# Patient Record
Sex: Female | Born: 1950 | Race: White | Hispanic: No | Smoking: Never smoker
Health system: Southern US, Community
[De-identification: ages and names within clinical notes are randomized; demographics above are authoritative.]

## PROBLEM LIST (undated history)

## (undated) DIAGNOSIS — E119 Type 2 diabetes mellitus without complications: Secondary | ICD-10-CM

## (undated) DIAGNOSIS — F32A Depression, unspecified: Secondary | ICD-10-CM

## (undated) DIAGNOSIS — M1990 Unspecified osteoarthritis, unspecified site: Secondary | ICD-10-CM

## (undated) DIAGNOSIS — Z9889 Other specified postprocedural states: Secondary | ICD-10-CM

## (undated) DIAGNOSIS — K219 Gastro-esophageal reflux disease without esophagitis: Secondary | ICD-10-CM

## (undated) HISTORY — PX: CHOLECYSTECTOMY: SHX55

## (undated) HISTORY — PX: ABDOMINAL HYSTERECTOMY: SHX81

---

## 2020-08-31 ENCOUNTER — Encounter: Payer: Self-pay | Admitting: Emergency Medicine

## 2020-08-31 ENCOUNTER — Ambulatory Visit (INDEPENDENT_AMBULATORY_CARE_PROVIDER_SITE_OTHER): Payer: Self-pay

## 2020-08-31 ENCOUNTER — Ambulatory Visit
Admission: EM | Admit: 2020-08-31 | Discharge: 2020-08-31 | Disposition: A | Discharge status: Home / Self Care | Payer: Medicare Other | Attending: Family Medicine | Admitting: Family Medicine

## 2020-08-31 ENCOUNTER — Telehealth: Payer: Self-pay | Admitting: Emergency Medicine

## 2020-08-31 ENCOUNTER — Other Ambulatory Visit: Payer: Self-pay

## 2020-08-31 DIAGNOSIS — R42 Dizziness and giddiness: Secondary | ICD-10-CM

## 2020-08-31 DIAGNOSIS — B349 Viral infection, unspecified: Secondary | ICD-10-CM | POA: Diagnosis not present

## 2020-08-31 DIAGNOSIS — R5383 Other fatigue: Secondary | ICD-10-CM | POA: Diagnosis not present

## 2020-08-31 DIAGNOSIS — R059 Cough, unspecified: Secondary | ICD-10-CM

## 2020-08-31 DIAGNOSIS — R630 Anorexia: Secondary | ICD-10-CM

## 2020-08-31 DIAGNOSIS — R0602 Shortness of breath: Secondary | ICD-10-CM

## 2020-08-31 DIAGNOSIS — R6889 Other general symptoms and signs: Secondary | ICD-10-CM | POA: Diagnosis not present

## 2020-08-31 HISTORY — DX: Unspecified osteoarthritis, unspecified site: M19.90

## 2020-08-31 MED ORDER — PROMETHAZINE-DM 6.25-15 MG/5ML PO SYRP: 5.0000 mL | ORAL_SOLUTION | Freq: Four times a day (QID) | ORAL | 0 refills | Status: DC | PRN

## 2020-08-31 MED ORDER — PREDNISONE 10 MG (21) PO TBPK: ORAL_TABLET | Freq: Every day | ORAL | 0 refills | Status: AC

## 2020-08-31 NOTE — ED Provider Notes (Signed)
[facility]   [id number] 08/31/20 Arrival Time: 1126   CC: COVID symptoms  SUBJECTIVE: History from: patient.  Marcha Licklider is a 70 y.o. female who presents with headache, intermittent dizziness and bilateral ear pain for the last week. Denies sick exposure to COVID, flu or strep. Denies recent travel. Has negative history of Covid. Has not completed Covid vaccines. Has not taken OTC medications for this. Reports that dizziness is worse with activity. Denies previous symptoms in the past. Denies fever, chills, fatigue, sinus pain, rhinorrhea, sore throat, SOB, wheezing, chest pain, nausea, changes in bowel or bladder habits.    ROS: As per HPI.  All other pertinent ROS negative.     Past Medical History:  Diagnosis Date  . Arthritis    Past Surgical History:  Procedure Laterality Date  . ABDOMINAL HYSTERECTOMY    . CHOLECYSTECTOMY     Allergies  Allergen Reactions  . Codeine    No current facility-administered medications on file prior to encounter.   No current outpatient medications on file prior to encounter.   Social History   Socioeconomic History  . Marital status: Widowed    Spouse name: Not on file  . Number of children: Not on file  . Years of education: Not on file  . Highest education level: Not on file  Occupational History  . Not on file  Tobacco Use  . Smoking status: Never Smoker  . Smokeless tobacco: Never Used  Vaping Use  . Vaping Use: Never used  Substance and Sexual Activity  . Alcohol use: Never  . Drug use: Never  . Sexual activity: Not on file  Other Topics Concern  . Not on file  Social History Narrative  . Not on file   Social Determinants of Health   Financial Resource Strain: Not on file  Food Insecurity: Not on file  Transportation Needs: Not on file  Physical Activity: Not on file  Stress: Not on file  Social Connections: Not on file  Intimate Partner Violence: Not on file   History reviewed. No pertinent family  history.  OBJECTIVE:  Vitals:   08/31/20 1152 08/31/20 1153  BP: (!) 164/99   Pulse: (!) 102   Resp: 20   Temp: 97.7 F (36.5 C)   SpO2: 95%   Weight:  180 lb (81.6 kg)  Height:  4' 11.5" (1.511 m)     General appearance: alert; appears fatigued, but nontoxic; speaking in full sentences and tolerating own secretions HEENT: NCAT; Ears: EACs clear, TMs pearly gray; Eyes: PERRL.  EOM grossly intact. Sinuses: nontender; Nose: nares patent with clear rhinorrhea, Throat: oropharynx erythematous, cobblestoning present, tonsils non erythematous or enlarged, uvula midline  Neck: supple without LAD Lungs: unlabored respirations, symmetrical air entry; cough: moderate; no respiratory distress; diminished lung sounds throughout bilateral lung fields Heart: regular rate and rhythm.  Radial pulses 2+ symmetrical bilaterally Skin: warm and dry Psychological: alert and cooperative; normal mood and affect  LABS:  No results found for this or any previous visit (from the past 24 hour(s)).   ASSESSMENT & PLAN:  1. Viral illness   2. Cough   3. Other fatigue   4. SOB (shortness of breath)   5. Loss of appetite   6. Dizziness and giddiness    Promethazine cough syrup prescribed Sedation precautions given I have also prescribed a steroid taper Will treat like bronchitis for now Chest xray negative for pneumonia Continue supportive care at home COVID and flu testing ordered.  It  will take between 2-3 days for test results. Someone will contact you regarding abnormal results.   Patient should remain in quarantine until they have received Covid results.  If negative you may resume normal activities (go back to work/school) while practicing hand hygiene, social distance, and mask wearing.  If positive, patient should remain in quarantine for at least 5 days from symptom onset AND greater than 72 hours after symptoms resolution (absence of fever without the use of fever-reducing medication and  improvement in respiratory symptoms), whichever is longer Get plenty of rest and push fluids Use OTC zyrtec for nasal congestion, runny nose, and/or sore throat Use OTC flonase for nasal congestion and runny nose Use medications daily for symptom relief Use OTC medications like ibuprofen or tylenol as needed fever or pain Call or go to the ED if you have any new or worsening symptoms such as fever, worsening cough, shortness of breath, chest tightness, chest pain, turning blue, changes in mental status.  Reviewed expectations re: course of current medical issues. Questions answered. Outlined signs and symptoms indicating need for more acute intervention. Patient verbalized understanding. After Visit Summary given.         Moshe Cipro, NP 09/02/20 1719

## 2020-08-31 NOTE — Discharge Instructions (Addendum)
Chest xray today is negative.  I have sent in cough syrup for you to take. This medication can make you sleepy. Do not drive while taking this medication.  I have sent in a prednisone taper for you to take for 6 days. 6 tablets on day one, 5 tablets on day two, 4 tablets on day three, 3 tablets on day four, 2 tablets on day five, and 1 tablet on day six.  Your COVID and Flu tests are pending.  You should self quarantine until the test results are back.    Take Tylenol or ibuprofen as needed for fever or discomfort.  Rest and keep yourself hydrated.    Follow-up with your primary care provider if your symptoms are not improving.

## 2020-08-31 NOTE — Telephone Encounter (Signed)
Meds to [facility] in [place]

## 2020-08-31 NOTE — ED Triage Notes (Signed)
Symptoms started last Saturday, headache, bilateral ear pain, dizzy

## 2020-09-03 DIAGNOSIS — R1110 Vomiting, unspecified: Secondary | ICD-10-CM | POA: Diagnosis not present

## 2020-09-03 DIAGNOSIS — R0902 Hypoxemia: Secondary | ICD-10-CM | POA: Diagnosis not present

## 2020-09-03 DIAGNOSIS — J1282 Pneumonia due to coronavirus disease 2019: Secondary | ICD-10-CM | POA: Diagnosis not present

## 2020-09-03 DIAGNOSIS — U071 COVID-19: Secondary | ICD-10-CM | POA: Diagnosis not present

## 2020-09-03 DIAGNOSIS — K529 Noninfective gastroenteritis and colitis, unspecified: Secondary | ICD-10-CM | POA: Diagnosis not present

## 2020-09-03 DIAGNOSIS — Z885 Allergy status to narcotic agent status: Secondary | ICD-10-CM | POA: Diagnosis not present

## 2020-09-03 DIAGNOSIS — R059 Cough, unspecified: Secondary | ICD-10-CM | POA: Diagnosis not present

## 2020-09-03 DIAGNOSIS — R197 Diarrhea, unspecified: Secondary | ICD-10-CM | POA: Diagnosis not present

## 2020-09-03 DIAGNOSIS — R112 Nausea with vomiting, unspecified: Secondary | ICD-10-CM | POA: Diagnosis not present

## 2020-09-03 DIAGNOSIS — J9601 Acute respiratory failure with hypoxia: Secondary | ICD-10-CM | POA: Diagnosis not present

## 2020-09-03 DIAGNOSIS — R109 Unspecified abdominal pain: Secondary | ICD-10-CM | POA: Diagnosis not present

## 2020-09-03 DIAGNOSIS — J9811 Atelectasis: Secondary | ICD-10-CM | POA: Diagnosis not present

## 2020-09-03 LAB — COVID-19, FLU A+B NAA
Influenza A, NAA: NOT DETECTED
Influenza B, NAA: NOT DETECTED
SARS-CoV-2, NAA: DETECTED — AB

## 2020-10-09 ENCOUNTER — Encounter (INDEPENDENT_AMBULATORY_CARE_PROVIDER_SITE_OTHER): Payer: Self-pay | Admitting: *Deleted

## 2020-10-22 DIAGNOSIS — Z1389 Encounter for screening for other disorder: Secondary | ICD-10-CM | POA: Diagnosis not present

## 2020-10-22 DIAGNOSIS — R1310 Dysphagia, unspecified: Secondary | ICD-10-CM | POA: Diagnosis not present

## 2020-10-22 DIAGNOSIS — Z86010 Personal history of colonic polyps: Secondary | ICD-10-CM | POA: Diagnosis not present

## 2020-10-22 DIAGNOSIS — H8110 Benign paroxysmal vertigo, unspecified ear: Secondary | ICD-10-CM | POA: Diagnosis not present

## 2020-11-20 DIAGNOSIS — H8110 Benign paroxysmal vertigo, unspecified ear: Secondary | ICD-10-CM | POA: Diagnosis not present

## 2020-11-20 DIAGNOSIS — R1310 Dysphagia, unspecified: Secondary | ICD-10-CM | POA: Diagnosis not present

## 2020-11-20 DIAGNOSIS — Z86010 Personal history of colonic polyps: Secondary | ICD-10-CM | POA: Diagnosis not present

## 2020-12-19 DIAGNOSIS — R1310 Dysphagia, unspecified: Secondary | ICD-10-CM | POA: Diagnosis not present

## 2020-12-19 DIAGNOSIS — H8110 Benign paroxysmal vertigo, unspecified ear: Secondary | ICD-10-CM | POA: Diagnosis not present

## 2020-12-19 DIAGNOSIS — Z86010 Personal history of colonic polyps: Secondary | ICD-10-CM | POA: Diagnosis not present

## 2021-01-06 ENCOUNTER — Ambulatory Visit (INDEPENDENT_AMBULATORY_CARE_PROVIDER_SITE_OTHER): Payer: Self-pay | Admitting: Gastroenterology

## 2021-02-18 DIAGNOSIS — R1310 Dysphagia, unspecified: Secondary | ICD-10-CM | POA: Diagnosis not present

## 2021-02-18 DIAGNOSIS — Z86010 Personal history of colonic polyps: Secondary | ICD-10-CM | POA: Diagnosis not present

## 2021-02-18 DIAGNOSIS — H8110 Benign paroxysmal vertigo, unspecified ear: Secondary | ICD-10-CM | POA: Diagnosis not present

## 2021-03-13 ENCOUNTER — Ambulatory Visit (INDEPENDENT_AMBULATORY_CARE_PROVIDER_SITE_OTHER): Payer: Self-pay | Admitting: Gastroenterology

## 2021-05-05 DIAGNOSIS — H5203 Hypermetropia, bilateral: Secondary | ICD-10-CM | POA: Diagnosis not present

## 2021-05-20 DIAGNOSIS — E7800 Pure hypercholesterolemia, unspecified: Secondary | ICD-10-CM | POA: Diagnosis not present

## 2021-05-20 DIAGNOSIS — Z13220 Encounter for screening for lipoid disorders: Secondary | ICD-10-CM | POA: Diagnosis not present

## 2021-05-23 DIAGNOSIS — H8110 Benign paroxysmal vertigo, unspecified ear: Secondary | ICD-10-CM | POA: Diagnosis not present

## 2021-05-23 DIAGNOSIS — R1310 Dysphagia, unspecified: Secondary | ICD-10-CM | POA: Diagnosis not present

## 2021-05-23 DIAGNOSIS — R7303 Prediabetes: Secondary | ICD-10-CM | POA: Diagnosis not present

## 2021-05-23 DIAGNOSIS — Z86010 Personal history of colonic polyps: Secondary | ICD-10-CM | POA: Diagnosis not present

## 2021-06-09 DIAGNOSIS — E875 Hyperkalemia: Secondary | ICD-10-CM | POA: Diagnosis not present

## 2021-08-18 DIAGNOSIS — R739 Hyperglycemia, unspecified: Secondary | ICD-10-CM | POA: Diagnosis not present

## 2021-08-18 DIAGNOSIS — E7800 Pure hypercholesterolemia, unspecified: Secondary | ICD-10-CM | POA: Diagnosis not present

## 2021-08-18 DIAGNOSIS — Z131 Encounter for screening for diabetes mellitus: Secondary | ICD-10-CM | POA: Diagnosis not present

## 2021-08-28 DIAGNOSIS — H8110 Benign paroxysmal vertigo, unspecified ear: Secondary | ICD-10-CM | POA: Diagnosis not present

## 2021-08-28 DIAGNOSIS — M25519 Pain in unspecified shoulder: Secondary | ICD-10-CM | POA: Diagnosis not present

## 2021-08-28 DIAGNOSIS — Z86010 Personal history of colonic polyps: Secondary | ICD-10-CM | POA: Diagnosis not present

## 2021-08-28 DIAGNOSIS — R1310 Dysphagia, unspecified: Secondary | ICD-10-CM | POA: Diagnosis not present

## 2021-09-04 DIAGNOSIS — K644 Residual hemorrhoidal skin tags: Secondary | ICD-10-CM | POA: Diagnosis not present

## 2021-09-04 DIAGNOSIS — Z1211 Encounter for screening for malignant neoplasm of colon: Secondary | ICD-10-CM | POA: Diagnosis not present

## 2021-09-04 DIAGNOSIS — E119 Type 2 diabetes mellitus without complications: Secondary | ICD-10-CM | POA: Diagnosis not present

## 2021-09-04 DIAGNOSIS — K641 Second degree hemorrhoids: Secondary | ICD-10-CM | POA: Diagnosis not present

## 2021-09-04 DIAGNOSIS — E785 Hyperlipidemia, unspecified: Secondary | ICD-10-CM | POA: Diagnosis not present

## 2021-09-04 DIAGNOSIS — Z86010 Personal history of colonic polyps: Secondary | ICD-10-CM | POA: Diagnosis not present

## 2021-09-04 DIAGNOSIS — I700 Atherosclerosis of aorta: Secondary | ICD-10-CM | POA: Diagnosis not present

## 2021-09-04 DIAGNOSIS — Z8616 Personal history of COVID-19: Secondary | ICD-10-CM | POA: Diagnosis not present

## 2021-09-04 DIAGNOSIS — Z885 Allergy status to narcotic agent status: Secondary | ICD-10-CM | POA: Diagnosis not present

## 2021-09-04 DIAGNOSIS — Q438 Other specified congenital malformations of intestine: Secondary | ICD-10-CM | POA: Diagnosis not present

## 2021-09-04 DIAGNOSIS — K5730 Diverticulosis of large intestine without perforation or abscess without bleeding: Secondary | ICD-10-CM | POA: Diagnosis not present

## 2021-09-04 DIAGNOSIS — K219 Gastro-esophageal reflux disease without esophagitis: Secondary | ICD-10-CM | POA: Diagnosis not present

## 2021-12-29 ENCOUNTER — Ambulatory Visit (INDEPENDENT_AMBULATORY_CARE_PROVIDER_SITE_OTHER): Payer: Medicare Other | Admitting: Ophthalmology

## 2021-12-29 ENCOUNTER — Encounter (INDEPENDENT_AMBULATORY_CARE_PROVIDER_SITE_OTHER): Payer: Self-pay | Admitting: Ophthalmology

## 2021-12-29 DIAGNOSIS — H3322 Serous retinal detachment, left eye: Secondary | ICD-10-CM

## 2021-12-29 DIAGNOSIS — E119 Type 2 diabetes mellitus without complications: Secondary | ICD-10-CM

## 2021-12-29 DIAGNOSIS — H25813 Combined forms of age-related cataract, bilateral: Secondary | ICD-10-CM

## 2021-12-29 DIAGNOSIS — H3581 Retinal edema: Secondary | ICD-10-CM

## 2021-12-29 NOTE — Progress Notes (Addendum)
?[facility] Note ? ?12/29/2021 ? ?  ? ?CHIEF COMPLAINT ?Patient presents for Retina Evaluation ? ? ?HISTORY OF PRESENT ILLNESS: ?Melody Freeman is a 71 y.o. female who presents to the clinic today for:  ? ?HPI   ? ? Retina Evaluation   ?In left eye.  This started 2 weeks ago.  Associated Symptoms Floaters and Distortion.  I, the attending physician,  performed the HPI with the patient and updated documentation appropriately. ? ?  ?  ? ? Comments   ?Patient fell in January and hit her head on pavement. She has noticed for two weeks that the left eye saw big floaters. She has had a vail that is across her vision that started last week.  ? ?  ?  ?Last edited by Bernarda Caffey, MD on 12/31/2021 12:28 AM.  ?  ?Pt is here on the referral of Dr. Jens Som for concern of RD OS, pt states about 2 weeks ago she started seeing new floaters in her vision and about a week ago, she started seeing a veil in her vision, pt denies being hypertensive, but was just dx as diabetic and was put on metformin, she also takes medication for cholesterol and depression, pt states in January, she fell and hit the left side of her head on the pavement, she cannot remember if she saw fol at that time ? ?Referring physician: ?Harlen Labs, MD ?[phone] [facility] [address] ?[place],  [place] [address] ? ?HISTORICAL INFORMATION:  ? ?Selected notes from the [facility] ?Referred by Dr. Marin Comment for concern of RD OS ?LEE:  ?Ocular Hx- ?PMH- ?  ? ?CURRENT MEDICATIONS: ?No current outpatient medications on file. (Ophthalmic Drugs)  ? ?No current facility-administered medications for this visit. (Ophthalmic Drugs)  ? ?Current Outpatient Medications (Other)  ?Medication Sig  ? promethazine-dextromethorphan (PROMETHAZINE-DM) 6.25-15 MG/5ML syrup Take 5 mLs by mouth 4 (four) times daily as needed for cough.  ? metFORMIN (GLUCOPHAGE-XR) 500 MG 24 hr tablet Take 1,000 mg by mouth 2 (two) times daily.  ? omeprazole (PRILOSEC) 20 MG capsule Take 20 mg by  mouth daily.  ? rosuvastatin (CRESTOR) 10 MG tablet SMARTSIG:1 Tablet(s) By Mouth Every Evening  ? sertraline (ZOLOFT) 50 MG tablet Take 50 mg by mouth daily.  ? ?No current facility-administered medications for this visit. (Other)  ? ?REVIEW OF SYSTEMS: ?ROS   ?Positive for: Eyes ?Last edited by [name], COT on 12/29/2021  2:22 PM.  ?  ? ?ALLERGIES ?Allergies  ?Allergen Reactions  ? Codeine   ? ?PAST MEDICAL HISTORY ?Past Medical History:  ?Diagnosis Date  ? Arthritis   ? ?Past Surgical History:  ?Procedure Laterality Date  ? ABDOMINAL HYSTERECTOMY    ? CHOLECYSTECTOMY    ? ? ?FAMILY HISTORY ?History reviewed. No pertinent family history. ? ?SOCIAL HISTORY ?Social History  ? ?Tobacco Use  ? Smoking status: Never  ? Smokeless tobacco: Never  ?Vaping Use  ? Vaping Use: Never used  ?Substance Use Topics  ? Alcohol use: Never  ? Drug use: Never  ?  ? ?  ?OPHTHALMIC EXAM: ? ?Base Eye Exam   ? ? Visual Acuity (Snellen - Linear)   ? ?   Right Left  ? Dist cc 20/25 HM  ? ? Correction: Glasses  ? ?  ?  ? ? Tonometry (Tonopen, 2:27 PM)   ? ?   Right Left  ? Pressure 17 19  ? ?  ?  ? ?  Pupils   ? ?   Dark  ? Right Dilated  ? Left Dilated  ? ?  ?  ? ? Visual Fields   ? ?   Left Right  ?   Full  ? Restrictions Total superior temporal, inferior temporal deficiencies   ? ?  ?  ? ? Extraocular Movement   ? ?   Right Left  ?  Full Abnormal  ?  -- -- --  ?--  --  ?-- -- --  ? -2 -- +2  ?--  --  ?-- -- --  ?  ? ?  ?  ? ? Neuro/Psych   ? ? Oriented x3: Yes  ? ?  ?  ? ? Dilation   ? ? Both eyes: 1.0% Mydriacyl, 2.5% Phenylephrine @ 2:24 PM  ? ?  ?  ? ?  ? ?Slit Lamp and Fundus Exam   ? ? Slit Lamp Exam   ? ?   Right Left  ? Lids/Lashes Dermatochalasis - upper lid, mild MGD Dermatochalasis - upper lid, mild MGD  ? Conjunctiva/Sclera White and quiet White and quiet  ? Cornea mild arcus, trace PEE mild arcus, trace PEE  ? Anterior Chamber Deep and quiet Deep and quiet  ? Iris Round and dilated, No NVI Round and dilated, No NVI   ? Lens 2+ Nuclear sclerosis, 2+ Cortical cataract 2+ Nuclear sclerosis, 2+ Cortical cataract  ? Anterior Vitreous Vitreous syneresis Vitreous syneresis, trace pigment  ? ?  ?  ? ? Fundus Exam   ? ?   Right Left  ? Disc Pink and Sharp Pink and Sharp  ? C/D Ratio 0.3 0.3  ? Macula Flat, Blunted foveal reflex, RPE mottling, No heme or edema +SRF, macula and fovea involving detachment  ? Vessels mild attenuation mild attenuation  ? Periphery Attached, No RT/RD, No heme Bullous ST detachment from 1100-0400 with small holes at 1200  ? ?  ?  ? ?  ? ?Refraction   ? ? Wearing Rx   ? ?   Sphere Cylinder Axis Add  ? Right +1.25 +1.00 015 +2.25  ? Left +1.50 Sphere  +2.25  ? ?  ?  ? ?  ? ? ?IMAGING AND PROCEDURES  ?Imaging and Procedures for 12/29/2021 ? ?OCT, Retina - OU - Both Eyes   ? ?   ?Right Eye ?Quality was good. Central Foveal Thickness: 285. Progression has no prior data. Findings include normal foveal contour, no IRF, no SRF, vitreomacular adhesion .  ? ?Left Eye ?Quality was good. Progression has no prior data. Findings include abnormal foveal contour, subretinal fluid, intraretinal fluid (Bullous mac off detachment greatest ST).  ? ?Notes ?*Images captured and stored on drive ? ?Diagnosis / Impression:  ?OD: NFP, no IRF/SRF ?OS: Bullous mac off detachment affecting ST quadrant ? ?Clinical management:  ?See below ? ?Abbreviations: NFP - Normal foveal profile. CME - cystoid macular edema. PED - pigment epithelial detachment. IRF - intraretinal fluid. SRF - subretinal fluid. EZ - ellipsoid zone. ERM - epiretinal membrane. ORA - outer retinal atrophy. ORT - outer retinal tubulation. SRHM - subretinal hyper-reflective material. IRHM - intraretinal hyper-reflective material ? ? ?  ? ?Color Fundus Photography Optos - OU - Both Eyes   ? ?   ?Right Eye ?Progression has no prior data. Disc findings include normal observations. Macula : normal observations. Vessels : attenuated. Periphery : normal observations.  ? ?Left  Eye ?Progression has no prior data. Disc findings include normal  observations. Macula : detached. Vessels : attenuated. Periphery : detachment (Bullous macula and fovea-involving RD from 11-4 (ST quad); no obvious RT).  ? ?Notes ?**Images stored on drive** ? ?Impression: ?OS: Bullous macula and fovea-involving RD from 11-4 (ST quad) ? ? ?  ?  ?  ? ?  ?ASSESSMENT/PLAN: ? ?  ICD-10-CM   ?1. Left retinal detachment  H33.22 OCT, Retina - OU - Both Eyes  ?  Color Fundus Photography Optos - OU - Both Eyes  ?  ?2. Diabetes mellitus type 2 without retinopathy ([place])  E11.9 OCT, Retina - OU - Both Eyes  ?  ?3. Combined forms of age-related cataract of both eyes  H25.813   ?  ? ? ?Rhegmatogenous retinal detachment, OS ?- bullous ST mac off detachment, onset of foveal involvement about 1-2 week ago by pt history ?- detached from 11 to 4 oclock, fovea off, microtears ~130 oclock within areas of very thin retina ?- The incidence, risk factors, and natural history of retinal detachment was discussed with patient.   ?- Potential treatment options including delimiting laser, pneumatic retinopexy, scleral buckle, and vitrectomy, cryotherapy and laser, and the use of air, gas, and oil discussed with patient. ?- The risks of blindness, loss of vision, infection, hemorrhage, cataract progression or lens displacement were discussed with patient. ?- recommend SBP with 25g PPV/EL/Gas OS under general anesthesia ?- pt wishes to proceed with surgery ?- RBA of procedure discussed, questions answered ?- informed consent obtained and signed ?- case scheduled for Thursday, Jan 08, 2022, 11:00am, [facility] OR 8 ?- f/u POD1, DFE OS only, no OCT ? ?2. Diabetes mellitus, type 2 without retinopathy ?- The incidence, risk factors for progression, natural history and treatment options for diabetic retinopathy  were discussed with patient.   ?- The need for close monitoring of blood glucose, blood pressure, and serum lipids, avoiding cigarette or any type of  tobacco, and the need for long term follow up was also discussed with patient. ?- f/u in 1 year, sooner prn ? ?3. Mixed Cataract OU ?- The symptoms of cataract, surgical options, and treatments and risks we

## 2021-12-30 IMAGING — DX DG CHEST 2V
2 series · 2 of 2 positions shown · non-contrast
Comparison: None.

CLINICAL DATA: 69-year-old female with cough and shortness of
breath.

EXAM:
CHEST - 2 VIEW

[chest pa]
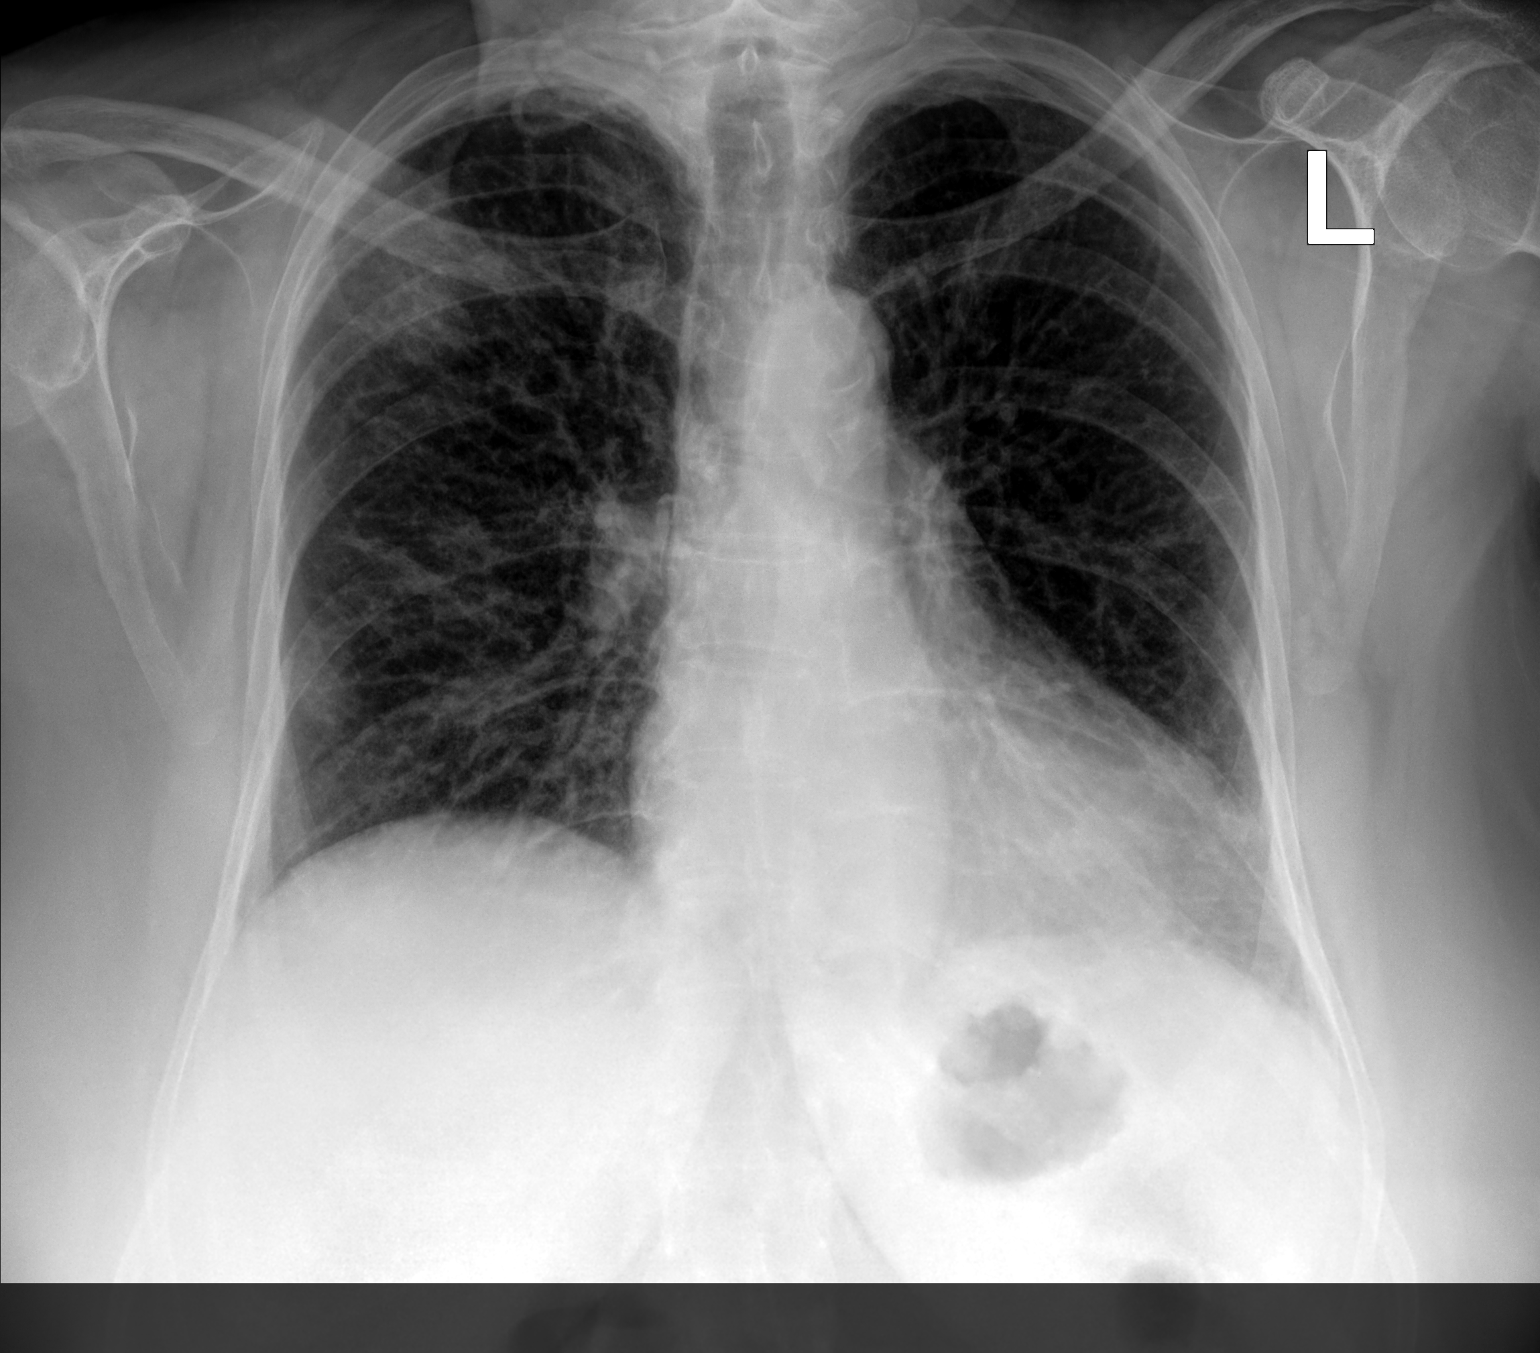

[chest lat]
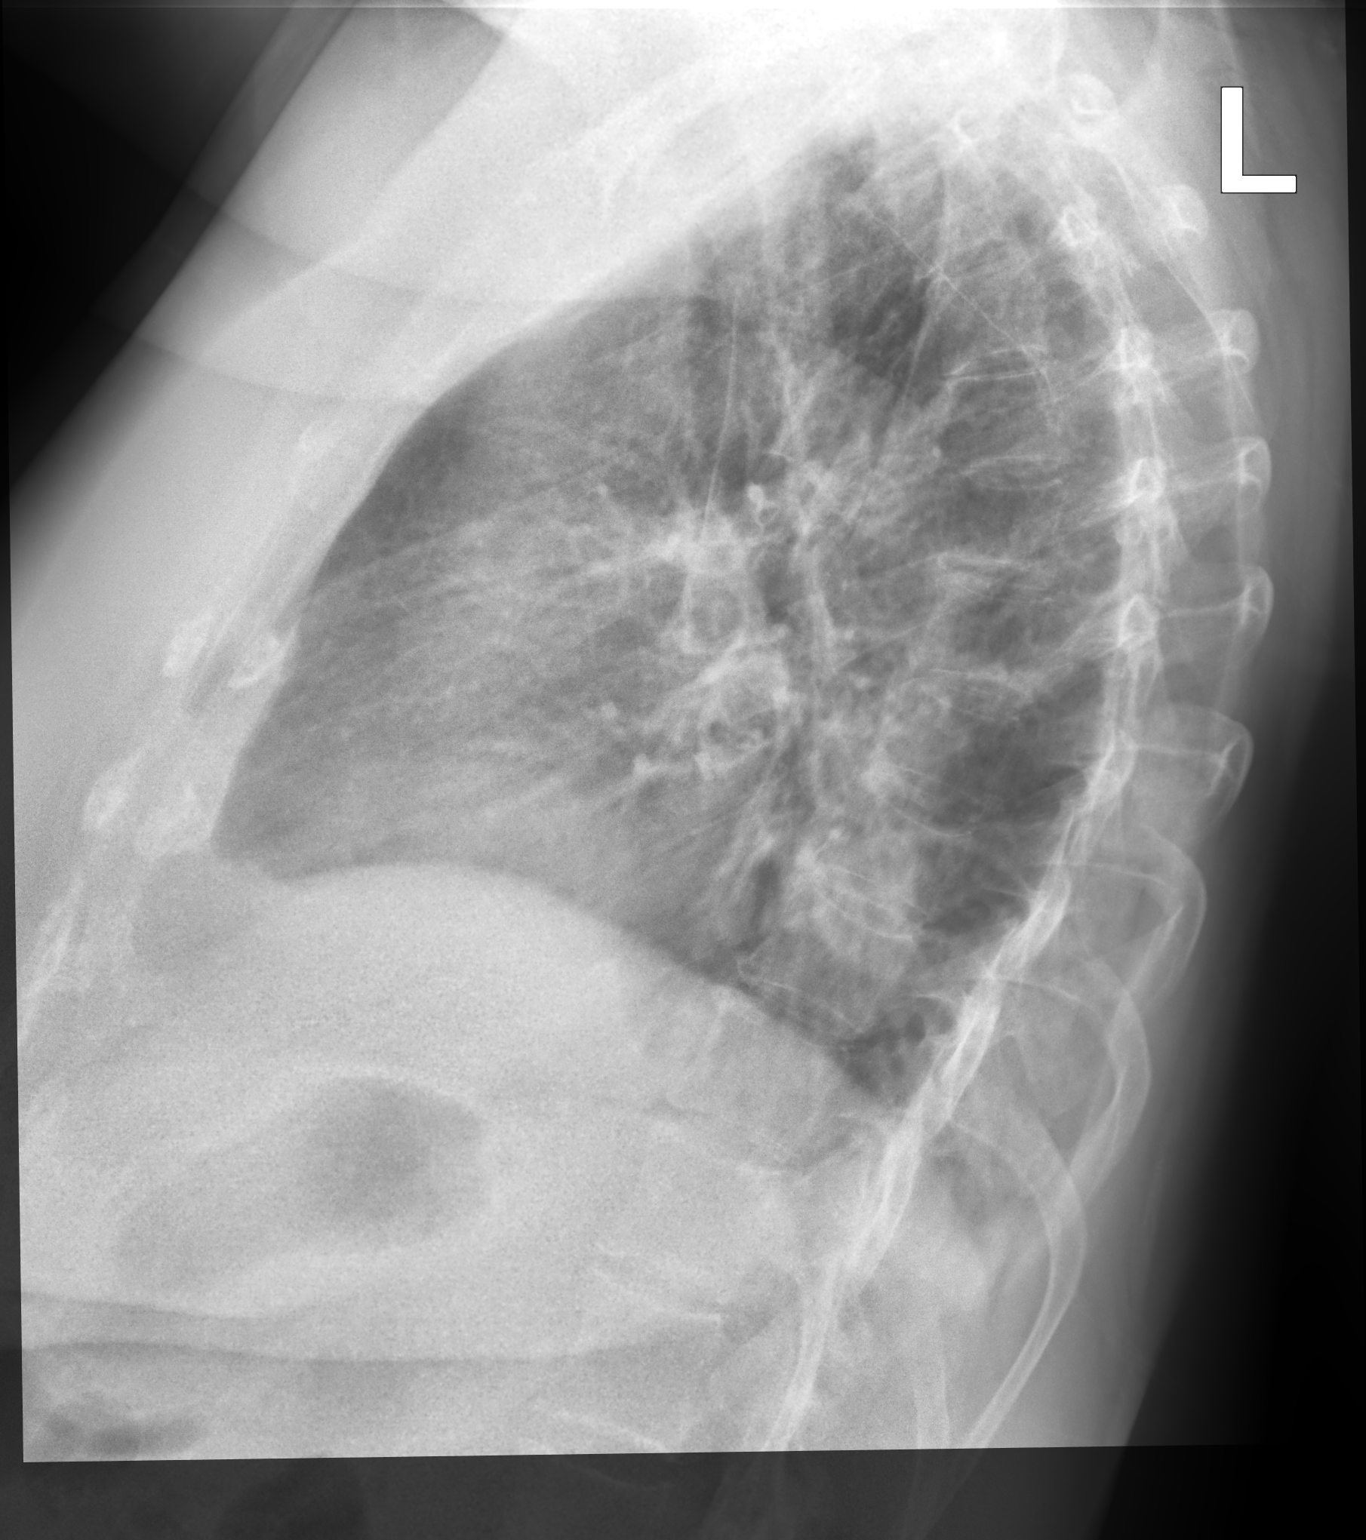

[2 of 2 positions shown; findings below may reference images not displayed]

FINDINGS: Mild cardiomegaly. The mediastinal contours are within normal
limits. Both lungs are clear. Atherosclerotic calcification of the
aortic arch. The visualized skeletal structures are unremarkable.
IMPRESSION: No active cardiopulmonary disease. Mild cardiomegaly. Aortic
Atherosclerosis ([id number]).

## 2021-12-31 ENCOUNTER — Encounter (INDEPENDENT_AMBULATORY_CARE_PROVIDER_SITE_OTHER): Payer: Self-pay | Admitting: Ophthalmology

## 2021-12-31 NOTE — H&P (Signed)
Melody Freeman is an 71 y.o. female.   ? ?Chief Complaint: retinal detachment, left eye ? ?HPI: Pt presented w/ 1-2 wk history of decreased vision OS. On dilated exam, was found to have a macula-involving retinal detachment in the left eye. After a discussion of the risks, benefits and alternatives to surgery, the patient has elected to proceed with surgical repair of the retinal detachment-- SBP + 25g PPV w/ endolaser and gas OS under general anesthesia. ? ?Past Medical History:  ?Diagnosis Date  ? Arthritis   ? ? ?Past Surgical History:  ?Procedure Laterality Date  ? ABDOMINAL HYSTERECTOMY    ? CHOLECYSTECTOMY    ? ? ?No family history on file. ?Social History:  reports that she has never smoked. She has never used smokeless tobacco. She reports that she does not drink alcohol and does not use drugs. ? ?Allergies:  ?Allergies  ?Allergen Reactions  ? Codeine   ? ? ?No medications prior to admission.  ? ? ?Review of systems otherwise negative ? ?There were no vitals taken for this visit. ? ?Physical exam: ?Mental status: oriented x3. ?Eyes: See eye exam associated with this date of surgery ?Ears, Nose, Throat: within normal limits ?Neck: Within Normal limits ?General: within normal limits ?Chest: Within normal limits ?Breast: deferred ?Heart: Within normal limits ?Abdomen: Within normal limits ?GU: deferred ?Extremities: within normal limits ?Skin: within normal limits ? ?Assessment/Plan ?Macula-involving retinal detachment, LEFT EYE ? ?Plan: To [facility] for scleral buckle + 25g PPV w/ endolaser and gas, OS, under general anesthesia. ?- case scheduled for Thursday, Jan 08, 1129 am, [facility] OR 08 ? ?Gardiner Sleeper, M.D., Ph.D. ?Vitreoretinal Surgeon ?[place] ? ? ? ?

## 2022-01-01 NOTE — Progress Notes (Incomplete)
Melody Freeman is an 71 y.o. female.   ? ?Chief Complaint: retinal detachment, left eye ? ?HPI: Pt presented w/ 1-2 wk history of decreased vision OS. On dilated exam, was found to have a macula-involving retinal detachment in the left eye. After a discussion of the risks, benefits and alternatives to surgery, the patient has elected to proceed with surgical repair of the retinal detachment-- SBP + 25g PPV w/ endolaser and gas OS under general anesthesia. ? ?Past Medical History:  ?Diagnosis Date  ? Arthritis   ? ? ?Past Surgical History:  ?Procedure Laterality Date  ? ABDOMINAL HYSTERECTOMY    ? CHOLECYSTECTOMY    ? ? ?No family history on file. ?Social History:  reports that she has never smoked. She has never used smokeless tobacco. She reports that she does not drink alcohol and does not use drugs. ? ?Allergies:  ?Allergies  ?Allergen Reactions  ? Codeine   ? ? ?(Not in a hospital admission) ? ? ?Review of systems otherwise negative ? ?There were no vitals taken for this visit. ? ?Physical exam: ?Mental status: oriented x3. ?Eyes: See eye exam associated with this date of surgery ?Ears, Nose, Throat: within normal limits ?Neck: Within Normal limits ?General: within normal limits ?Chest: Within normal limits ?Breast: deferred ?Heart: Within normal limits ?Abdomen: Within normal limits ?GU: deferred ?Extremities: within normal limits ?Skin: within normal limits ? ?Assessment/Plan ?Macula-involving retinal detachment, LEFT EYE ? ?Plan: To [facility] for scleral buckle + 25g PPV w/ endolaser and gas, OS, under general anesthesia. ?- case scheduled for Thursday, Jan 08, 1129 am, [facility] OR 08 ? ?Gardiner Sleeper, M.D., Ph.D. ?Vitreoretinal Surgeon ?[place] ? ? ? ?

## 2022-01-01 NOTE — Progress Notes (Signed)
[facility] Note  01/09/2022     CHIEF COMPLAINT Patient presents for Retina Follow Up   HISTORY OF PRESENT ILLNESS: Melody Freeman is a 71 y.o. female who presents to the clinic today for:   HPI     Retina Follow Up   Patient presents with  Retinal Break/Detachment.  In left eye.  Severity is moderate.  Duration of 1 day.  Since onset it is stable.  I, the attending physician,  performed the HPI with the patient and updated documentation appropriately.        Comments   Pt here for 1 day post op RD repair OS. Pt states shes feeling ok this morning, slightly nauseated. Took tylenol last night. No complaints of any major discomfort eye wise      Last edited by Bernarda Caffey, MD on 01/12/2022 12:48 PM.    Pt states she was nauseous after sx last night, she states she took a couple Tylenol for pain, she slept with on her right side with her face towards the ground  Referring physician: Saybrook Nation, MD [place],  [place] [address]  HISTORICAL INFORMATION:   Selected notes from the MEDICAL RECORD NUMBER Referred by Dr. Marin Comment for concern of RD OS LEE:  Ocular Hx- PMH-    CURRENT MEDICATIONS: No current outpatient medications on file. (Ophthalmic Drugs)   No current facility-administered medications for this visit. (Ophthalmic Drugs)   Current Outpatient Medications (Other)  Medication Sig   acetaminophen (TYLENOL) 500 MG tablet Take 1,000 mg by mouth every 6 (six) hours as needed.   HYDROcodone-acetaminophen (NORCO/VICODIN) 5-325 MG tablet Take 1 tablet by mouth every 4 (four) hours as needed for moderate pain.   ibuprofen (ADVIL) 200 MG tablet Take 600 mg by mouth every 6 (six) hours as needed for headache or moderate pain.   metFORMIN (GLUCOPHAGE-XR) 500 MG 24 hr tablet Take 1,000 mg by mouth 2 (two) times daily.   omeprazole (PRILOSEC) 20 MG capsule Take 20 mg by mouth daily as needed (acid reflux).   rosuvastatin (CRESTOR) 10  MG tablet Take 10 mg by mouth daily.   sertraline (ZOLOFT) 50 MG tablet Take 50 mg by mouth daily.   No current facility-administered medications for this visit. (Other)   REVIEW OF SYSTEMS: ROS   Positive for: Eyes Negative for: Constitutional, Gastrointestinal, Neurological, Skin, Genitourinary, Musculoskeletal, HENT, Endocrine, Cardiovascular, Respiratory, Psychiatric, Allergic/Imm, Heme/Lymph Last edited by Kingsley Spittle, COT on 01/09/2022  7:47 AM.     ALLERGIES Allergies  Allergen Reactions   Codeine Nausea And Vomiting   PAST MEDICAL HISTORY Past Medical History:  Diagnosis Date   Arthritis    Depression    Diabetes mellitus without complication ([place])    GERD (gastroesophageal reflux disease)    PONV (postoperative nausea and vomiting)    Past Surgical History:  Procedure Laterality Date   ABDOMINAL HYSTERECTOMY     AIR/FLUID EXCHANGE Left 01/08/2022   Procedure: AIR/FLUID EXCHANGE;  Surgeon: Bernarda Caffey, MD;  Location: [facility];  Service: Ophthalmology;  Laterality: Left;   CHOLECYSTECTOMY     GAS INSERTION Left 01/08/2022   Procedure: INSERTION OF GAS C3F8;  Surgeon: Bernarda Caffey, MD;  Location: [place] [facility];  Service: Ophthalmology;  Laterality: Left;   PERFLUORONE INJECTION Left 01/08/2022   Procedure: PERFLUORON INJECTION;  Surgeon: Bernarda Caffey, MD;  Location: [facility];  Service: Ophthalmology;  Laterality: Left;   PHOTOCOAGULATION WITH LASER Left 01/08/2022   Procedure:  PHOTOCOAGULATION WITH LASER;  Surgeon: Bernarda Caffey, MD;  Location: [facility];  Service: Ophthalmology;  Laterality: Left;   VITRECTOMY 25 GAUGE WITH SCLERAL BUCKLE Left 01/08/2022   Procedure: TWENTY-FIVE GAUGE PARS PLANA VITRECTOMY WITH SCLERAL BUCKLE;  Surgeon: Bernarda Caffey, MD;  Location: [facility];  Service: Ophthalmology;  Laterality: Left;   FAMILY HISTORY History reviewed. No pertinent family history.  SOCIAL HISTORY Social History   Tobacco Use   Smoking status: Never   Smokeless tobacco: Never   Vaping Use   Vaping Use: Never used  Substance Use Topics   Alcohol use: Never   Drug use: Never       OPHTHALMIC EXAM:  Base Eye Exam     Visual Acuity (Snellen - Linear)       Right Left   Dist [place] def HM   Dist ph [place]  NI         Tonometry (Tonopen, 7:55 AM)       Right Left   Pressure 17 18         Pupils       Dark Light Shape React APD   Right 3 2 Round Minimal None   Left dilated             Visual Fields       Left Right     Full   Restrictions Partial outer superior temporal, inferior temporal, superior nasal, inferior nasal deficiencies          Extraocular Movement       Right Left    Full, Ortho Full, Ortho         Neuro/Psych     Oriented x3: Yes   Mood/Affect: Normal         Dilation     Left eye: 1.0% Mydriacyl, 2.5% Phenylephrine @ 7:58 AM           Slit Lamp and Fundus Exam     Slit Lamp Exam       Right Left   Lids/Lashes Dermatochalasis - upper lid, mild MGD Dermatochalasis - upper lid, mild MGD, lid edema   Conjunctiva/Sclera White and quiet Subconjunctival hemorrhage, sutures intact   Cornea mild arcus, trace PEE Central epi defect, mild haze   Anterior Chamber Deep and quiet Deep, 2-3+ pigment, early fibrin reaction   Iris Round and dilated, No NVI Round and dilated, No NVI   Lens 2+ Nuclear sclerosis, 2+ Cortical cataract 2+ Nuclear sclerosis, 2+ Cortical cataract, 2+ PC feathering   Anterior Vitreous Vitreous syneresis post vitrectomy, good gas fill         Fundus Exam       Right Left   Disc  perfused   C/D Ratio 0.3 0.3   Macula  Hazy view, flat under gas   Vessels  mild attenuation   Periphery  Attached over buckle, good buckle height, good laser over buckle and around tears, PRE-OP: Bull[place] ST detachment from 1100-0400 with small holes at 1200           IMAGING AND PROCEDURES  Imaging and Procedures for 01/09/2022          ASSESSMENT/PLAN:    ICD-10-CM   1. Left retinal  detachment  H33.22     2. Diabetes mellitus type 2 without retinopathy ([place])  E11.9     3. 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, onset of foveal  involvement about 1-2 week ago by pt history - detached from 11 to 4 oclock, fovea off, microtears ~130 oclock within areas of very thin retina - POD1 s/p PPV/POC/EL/FAX/14% C3F8 OS, 05.18.2023             - doing well this morning             - retina attached and in good position -- good buckle height and laser around breaks             - IOP okay at 18             - start   PF q2h OS                          zymaxid QID OS                          Atropine BID OS                          PSO ung QID OS              - cont face down positioning x3 days; avoid laying flat on back              - eye shield when sleeping              - post op drop and positioning instructions reviewed              - tylenol/ibuprofen for pain              - Rx given for breakthrough pain  - f/u next Thursday  2. Diabetes mellitus, type 2 without retinopathy - The incidence, risk factors for progression, natural history and treatment options for diabetic retinopathy  were discussed with patient.   - The need for close monitoring of blood glucose, blood pressure, and serum lipids, avoiding cigarette or any type of tobacco, and the need for long term follow up was also discussed with patient.  - f/u in 1 year, sooner prn  3. Mixed Cataract OU - The symptoms of cataract, surgical options, and treatments and risks were discussed with patient. - discussed diagnosis and progression -- specifically post vitrectomy progression - monitor  Ophthalmic Meds Ordered this visit:  No orders of the defined types were placed in this encounter.    Return in about 6 days (around 01/15/2022) for f/u RD OS, DFE.  There are no Patient Instructions on file for this  visit.   Explained the diagnoses, plan, and follow up with the patient and they expressed understanding.  Patient expressed understanding of the importance of proper follow up care.   This document serves as a record of services personally performed by Gardiner Sleeper, MD, PhD. It was created on their behalf by Leonie Douglas, an ophthalmic technician. The creation of this record is the provider's dictation and/or activities during the visit.    Electronically signed by: Leonie Douglas COA, 01/12/22  12:51 PM  This document serves as a record of services personally performed by Gardiner Sleeper, MD, PhD. It was created on their behalf by San Jetty. Owens Shark, OA an ophthalmic technician. The creation of this record is the provider's dictation and/or activities during the visit.    Electronically signed by: San Jetty. Owens Shark, [place] 05.18.2023 12:51 PM  Gardiner Sleeper, M.D.,  Ph.D. Diseases & Surgery of the Retina and Vitreous [facility]  I have reviewed the above documentation for accuracy and completeness, and I agree with the above. Gardiner Sleeper, M.D., Ph.D. 01/12/22 12:51 PM  Abbreviations: M myopia (nearsighted); A astigmatism; H hyperopia (farsighted); P presbyopia; Mrx spectacle prescription;  CTL contact lenses; OD right eye; OS left eye; OU both eyes  XT exotropia; ET esotropia; PEK punctate epithelial keratitis; PEE punctate epithelial erosions; DES dry eye syndrome; MGD meibomian gland dysfunction; ATs artificial tears; PFAT's preservative free artificial tears; [place] nuclear sclerotic cataract; PSC posterior subcapsular cataract; ERM epi-retinal membrane; PVD posterior vitreous detachment; RD retinal detachment; DM diabetes mellitus; DR diabetic retinopathy; NPDR non-proliferative diabetic retinopathy; PDR proliferative diabetic retinopathy; CSME clinically significant macular edema; DME diabetic macular edema; dbh dot blot hemorrhages; CWS cotton wool spot; POAG primary open  angle glaucoma; C/D cup-to-disc ratio; HVF humphrey visual field; GVF goldmann visual field; OCT optical coherence tomography; IOP intraocular pressure; BRVO Branch retinal vein occlusion; CRVO central retinal vein occlusion; CRAO central retinal artery occlusion; BRAO branch retinal artery occlusion; RT retinal tear; SB scleral buckle; PPV pars plana vitrectomy; VH Vitreous hemorrhage; PRP panretinal laser photocoagulation; IVK intravitreal kenalog; VMT vitreomacular traction; MH Macular hole;  NVD neovascularization of the disc; NVE neovascularization elsewhere; AREDS age related eye disease study; ARMD age related macular degeneration; POAG primary open angle glaucoma; EBMD epithelial/anterior basement membrane dystrophy; ACIOL anterior chamber intraocular lens; IOL intraocular lens; PCIOL posterior chamber intraocular lens; Phaco/IOL phacoemulsification with intraocular lens placement; [place] photorefractive keratectomy; LASIK laser assisted in situ keratomileusis; HTN hypertension; DM diabetes mellitus; COPD chronic obstructive pulmonary disease

## 2022-01-06 ENCOUNTER — Encounter (HOSPITAL_COMMUNITY): Payer: Self-pay | Admitting: Ophthalmology

## 2022-01-06 ENCOUNTER — Other Ambulatory Visit: Payer: Self-pay

## 2022-01-06 NOTE — Progress Notes (Signed)
PCP - Drema Dallas. Jimmye Norman, MD ?Cardiologist - denies ? ?PPM/ICD - denies ? ?Chest x-ray - 08/31/2020 ?EKG - n/a ?Stress Test - denies ?ECHO - denies ?Cardiac Cath - denies ? ?CPAP - n/a ? ?Fasting Blood Sugar - patient is nor checking CBG at home ? ?Blood Thinner Instructions: n/a ? ?Aspirin Instructions: Patient was instructed: As of today, STOP taking any Aspirin (unless otherwise instructed by your surgeon) Aleve, Naproxen, Ibuprofen, Motrin, Advil, Goody's, BC's, all herbal medications, fish oil, and all vitamins. ? ?ERAS Protcol - yes, until 08:30 ? ?COVID TEST- n/a ? ?Anesthesia review: no ? ?Patient verbally denies any shortness of breath, fever, cough and chest pain during phone call ? ? ?-------------  SDW INSTRUCTIONS given: ? ?Your procedure is scheduled on Thursday, May 18th, 2023. ? Report to [facility] Main Entrance "A" at 09:00 A.M., and check in at the Admitting office. ? Call this number if you have problems the morning of surgery: ? [phone] ? ? Remember: ? Do not eat after midnight the night before your surgery ? ?You may drink clear liquids until 08:30 the morning of your surgery.   ?Clear liquids allowed are: Water, Non-Citrus Juices (without pulp), Carbonated Beverages, Clear Tea, Black Coffee Only, and Gatorade ?  ? Take these medicines the morning of surgery with A SIP OF WATER Crestor, Zoloft; Prilosec - PRN ? ?Hold Metformin on the day of surgery ? ? The day of surgery: ?         ?           Do not wear jewelry, make up, or nail polish ?           Do not wear lotions, powders, perfumes, or deodorant. ?           Do not shave 48 hours prior to surgery.   ?           Do not bring valuables to the hospital. ?           [facility] is not responsible for any belongings or valuables. ? ?Do NOT Smoke (Tobacco/Vaping) 24 hours prior to your procedure ?If you use a CPAP at night, you may bring all equipment for your overnight stay. ?  ?Contacts, glasses, dentures or bridgework may not be worn  into surgery.    ?  ?For patients admitted to the hospital, discharge time will be determined by your treatment team. ?  ?Patients discharged the day of surgery will not be allowed to drive home, and someone needs to stay with them for 24 hours. ? ? ?Special instructions:   ?[facility]- Preparing For Surgery ? ?Before surgery, you can play an important role. Because skin is not sterile, your skin needs to be as free of germs as possible. You can reduce the number of germs on your skin by washing with CHG (chlorahexidine gluconate) Soap before surgery.  CHG is an antiseptic cleaner which kills germs and bonds with the skin to continue killing germs even after washing.   ? ?Oral Hygiene is also important to reduce your risk of infection.  Remember - BRUSH YOUR TEETH THE MORNING OF SURGERY WITH YOUR REGULAR TOOTHPASTE ? ?Please do not use if you have an allergy to CHG or antibacterial soaps. If your skin becomes reddened/irritated stop using the CHG.  ?Do not shave (including legs and underarms) for at least 48 hours prior to first CHG shower. It is OK to shave your face. ? ?Please follow these instructions  carefully. ?  ?Shower the NIGHT BEFORE SURGERY and the MORNING OF SURGERY with DIAL Soap.  ? ?Pat yourself dry with a CLEAN TOWEL. ? ?Wear CLEAN PAJAMAS to bed the night before surgery ? ?Place CLEAN SHEETS on your bed the night of your first shower and DO NOT SLEEP WITH PETS. ? ? ?Day of Surgery: ?Please shower morning of surgery  ?Wear Clean/Comfortable clothing the morning of surgery ?Do not apply any deodorants/lotions.   ?Remember to brush your teeth WITH YOUR REGULAR TOOTHPASTE. ?  ?Questions were answered. Patient verbalized understanding of instructions.  ? ? ?    ?

## 2022-01-08 ENCOUNTER — Ambulatory Visit (HOSPITAL_COMMUNITY)
Admission: RE | Admit: 2022-01-08 | Discharge: 2022-01-08 | Disposition: A | Discharge status: Home / Self Care | Payer: Medicare Other | Attending: Ophthalmology | Admitting: Ophthalmology

## 2022-01-08 ENCOUNTER — Encounter (HOSPITAL_COMMUNITY)
Admission: RE | Disposition: A | Discharge status: Home / Self Care | Payer: Self-pay | Source: Home / Self Care | Attending: Ophthalmology

## 2022-01-08 ENCOUNTER — Encounter (HOSPITAL_COMMUNITY): Payer: Self-pay | Admitting: Ophthalmology

## 2022-01-08 ENCOUNTER — Other Ambulatory Visit: Payer: Self-pay

## 2022-01-08 ENCOUNTER — Ambulatory Visit (HOSPITAL_COMMUNITY): Payer: Medicare Other | Admitting: Anesthesiology

## 2022-01-08 ENCOUNTER — Ambulatory Visit (HOSPITAL_BASED_OUTPATIENT_CLINIC_OR_DEPARTMENT_OTHER): Payer: Medicare Other | Admitting: Anesthesiology

## 2022-01-08 DIAGNOSIS — I10 Essential (primary) hypertension: Secondary | ICD-10-CM | POA: Diagnosis not present

## 2022-01-08 DIAGNOSIS — E119 Type 2 diabetes mellitus without complications: Secondary | ICD-10-CM | POA: Diagnosis not present

## 2022-01-08 DIAGNOSIS — K219 Gastro-esophageal reflux disease without esophagitis: Secondary | ICD-10-CM | POA: Insufficient documentation

## 2022-01-08 DIAGNOSIS — H33002 Unspecified retinal detachment with retinal break, left eye: Secondary | ICD-10-CM | POA: Insufficient documentation

## 2022-01-08 DIAGNOSIS — Z7984 Long term (current) use of oral hypoglycemic drugs: Secondary | ICD-10-CM | POA: Diagnosis not present

## 2022-01-08 DIAGNOSIS — F32A Depression, unspecified: Secondary | ICD-10-CM | POA: Insufficient documentation

## 2022-01-08 DIAGNOSIS — M1990 Unspecified osteoarthritis, unspecified site: Secondary | ICD-10-CM | POA: Diagnosis not present

## 2022-01-08 DIAGNOSIS — H338 Other retinal detachments: Secondary | ICD-10-CM

## 2022-01-08 HISTORY — PX: GAS INSERTION: SHX5336

## 2022-01-08 HISTORY — DX: Depression, unspecified: F32.A

## 2022-01-08 HISTORY — PX: VITRECTOMY 25 GAUGE WITH SCLERAL BUCKLE: SHX6183

## 2022-01-08 HISTORY — DX: Type 2 diabetes mellitus without complications: E11.9

## 2022-01-08 HISTORY — DX: Other specified postprocedural states: Z98.890

## 2022-01-08 HISTORY — PX: AIR/FLUID EXCHANGE: SHX6494

## 2022-01-08 HISTORY — PX: PHOTOCOAGULATION WITH LASER: SHX6027

## 2022-01-08 HISTORY — PX: PERFLUORONE INJECTION: SHX5302

## 2022-01-08 HISTORY — DX: Gastro-esophageal reflux disease without esophagitis: K21.9

## 2022-01-08 LAB — CBC
HCT: 39.2 % (ref 36.0–46.0)
Hemoglobin: 12.9 g/dL (ref 12.0–15.0)
MCH: 31.5 pg (ref 26.0–34.0)
MCHC: 32.9 g/dL (ref 30.0–36.0)
MCV: 95.8 fL (ref 80.0–100.0)
Platelets: 211 10*3/uL (ref 150–400)
RBC: 4.09 MIL/uL (ref 3.87–5.11)
RDW: 12.2 % (ref 11.5–15.5)
WBC: 4.2 10*3/uL (ref 4.0–10.5)
nRBC: 0 % (ref 0.0–0.2)

## 2022-01-08 LAB — GLUCOSE, CAPILLARY
Glucose-Capillary: 105 mg/dL — ABNORMAL HIGH (ref 70–99)
Glucose-Capillary: 105 mg/dL — ABNORMAL HIGH (ref 70–99)
Glucose-Capillary: 92 mg/dL (ref 70–99)
Glucose-Capillary: 112 mg/dL — ABNORMAL HIGH (ref 70–99)

## 2022-01-08 LAB — BASIC METABOLIC PANEL
Anion gap: 8 (ref 5–15)
BUN: 13 mg/dL (ref 8–23)
CO2: 23 mmol/L (ref 22–32)
Calcium: 8.8 mg/dL — ABNORMAL LOW (ref 8.9–10.3)
Chloride: 109 mmol/L (ref 98–111)
Creatinine, Ser: 0.52 mg/dL (ref 0.44–1.00)
GFR, Estimated: >60 mL/min (ref >60)
Glucose, Bld: 94 mg/dL (ref 70–99)
Potassium: 4 mmol/L (ref 3.5–5.1)
Sodium: 140 mmol/L (ref 135–145)

## 2022-01-08 SURGERY — VITRECTOMY, USING 25-GAUGE INSTRUMENTS, WITH SCLERAL BUCKLING
Anesthesia: General | Site: Eye | Laterality: Left

## 2022-01-08 MED ORDER — PHENYLEPHRINE 80 MCG/ML (10ML) SYRINGE FOR IV PUSH (FOR BLOOD PRESSURE SUPPORT)
PREFILLED_SYRINGE | INTRAVENOUS | Status: DC | PRN
  Administered 2022-01-08: 160 ug via INTRAVENOUS
  Administered 2022-01-08: 80 ug via INTRAVENOUS
  Administered 2022-01-08: 160 ug via INTRAVENOUS

## 2022-01-08 MED ORDER — BACITRACIN-POLYMYXIN B 500-10000 UNIT/GM OP OINT
TOPICAL_OINTMENT | OPHTHALMIC | Status: AC
  Filled 2022-01-08: qty 3.5

## 2022-01-08 MED ORDER — PHENYLEPHRINE HCL-NACL 20-0.9 MG/250ML-% IV SOLN
INTRAVENOUS | Status: DC | PRN
  Administered 2022-01-08: 25 ug/min via INTRAVENOUS

## 2022-01-08 MED ORDER — LACTATED RINGERS IV SOLN: INTRAVENOUS | Status: DC

## 2022-01-08 MED ORDER — ATROPINE SULFATE 1 % OP SOLN
OPHTHALMIC | Status: AC
  Filled 2022-01-08: qty 5

## 2022-01-08 MED ORDER — STERILE WATER FOR INJECTION IJ SOLN
INTRAMUSCULAR | Status: AC
  Filled 2022-01-08: qty 10

## 2022-01-08 MED ORDER — FENTANYL CITRATE (PF) 250 MCG/5ML IJ SOLN
INTRAMUSCULAR | Status: AC
  Filled 2022-01-08: qty 5

## 2022-01-08 MED ORDER — BSS IO SOLN
INTRAOCULAR | Status: DC | PRN
  Administered 2022-01-08: 15 mL

## 2022-01-08 MED ORDER — GATIFLOXACIN 0.5 % OP SOLN
OPHTHALMIC | Status: AC
  Filled 2022-01-08: qty 2.5

## 2022-01-08 MED ORDER — DEXAMETHASONE SODIUM PHOSPHATE 10 MG/ML IJ SOLN
INTRAMUSCULAR | Status: AC
  Filled 2022-01-08: qty 1

## 2022-01-08 MED ORDER — CEFTAZIDIME 1 G IJ SOLR
INTRAMUSCULAR | Status: AC
  Filled 2022-01-08: qty 1

## 2022-01-08 MED ORDER — PROPARACAINE HCL 0.5 % OP SOLN
1.0000 [drp] | OPHTHALMIC | Status: AC | PRN
Start: 2022-01-08 — End: 2022-01-08
  Administered 2022-01-08 (×3): 1 [drp] via OPHTHALMIC
  Filled 2022-01-08: qty 15

## 2022-01-08 MED ORDER — AMISULPRIDE (ANTIEMETIC) 5 MG/2ML IV SOLN
10.0000 mg | Freq: Once | INTRAVENOUS | Status: AC | PRN
  Administered 2022-01-08: 10 mg via INTRAVENOUS

## 2022-01-08 MED ORDER — BSS PLUS IO SOLN
INTRAOCULAR | Status: AC
  Filled 2022-01-08: qty 500

## 2022-01-08 MED ORDER — EPINEPHRINE PF 1 MG/ML IJ SOLN
INTRAMUSCULAR | Status: AC
  Filled 2022-01-08: qty 1

## 2022-01-08 MED ORDER — TOBRAMYCIN-DEXAMETHASONE 0.3-0.1 % OP OINT
TOPICAL_OINTMENT | OPHTHALMIC | Status: AC
  Filled 2022-01-08: qty 3.5

## 2022-01-08 MED ORDER — TROPICAMIDE 1 % OP SOLN
1.0000 [drp] | OPHTHALMIC | Status: AC | PRN
  Administered 2022-01-08 (×3): 1 [drp] via OPHTHALMIC
  Filled 2022-01-08: qty 15

## 2022-01-08 MED ORDER — INSULIN ASPART 100 UNIT/ML IJ SOLN: 0.0000 [IU] | INTRAMUSCULAR | Status: DC | PRN

## 2022-01-08 MED ORDER — SODIUM CHLORIDE 0.9 % IV SOLN: INTRAVENOUS | Status: DC

## 2022-01-08 MED ORDER — LIDOCAINE HCL (PF) 2 % IJ SOLN
INTRAMUSCULAR | Status: AC
  Filled 2022-01-08: qty 10

## 2022-01-08 MED ORDER — DEXAMETHASONE SODIUM PHOSPHATE 10 MG/ML IJ SOLN
INTRAMUSCULAR | Status: DC | PRN
  Administered 2022-01-08: 5 mg via INTRAVENOUS

## 2022-01-08 MED ORDER — HYDROCODONE-ACETAMINOPHEN 5-325 MG PO TABS
1.0000 | ORAL_TABLET | ORAL | 0 refills | Status: AC | PRN
Start: 2022-01-08 — End: 2023-01-08

## 2022-01-08 MED ORDER — PREDNISOLONE ACETATE 1 % OP SUSP
OPHTHALMIC | Status: AC
  Filled 2022-01-08: qty 5

## 2022-01-08 MED ORDER — DORZOLAMIDE HCL-TIMOLOL MAL 2-0.5 % OP SOLN
OPHTHALMIC | Status: DC | PRN
  Administered 2022-01-08: 1 [drp] via OPHTHALMIC

## 2022-01-08 MED ORDER — TRIAMCINOLONE ACETONIDE 40 MG/ML IJ SUSP
INTRAMUSCULAR | Status: AC
  Filled 2022-01-08: qty 5

## 2022-01-08 MED ORDER — ACETAMINOPHEN 500 MG PO TABS
1000.0000 mg | ORAL_TABLET | Freq: Once | ORAL | Status: AC
  Administered 2022-01-08: 1000 mg via ORAL
  Filled 2022-01-08: qty 2

## 2022-01-08 MED ORDER — ORAL CARE MOUTH RINSE: 15.0000 mL | Freq: Once | OROMUCOSAL | Status: AC

## 2022-01-08 MED ORDER — BSS IO SOLN
INTRAOCULAR | Status: AC
  Filled 2022-01-08: qty 15

## 2022-01-08 MED ORDER — BRIMONIDINE TARTRATE 0.2 % OP SOLN
OPHTHALMIC | Status: DC | PRN
  Administered 2022-01-08: 1 [drp] via OPHTHALMIC

## 2022-01-08 MED ORDER — CHLORHEXIDINE GLUCONATE 0.12 % MT SOLN
15.0000 mL | Freq: Once | OROMUCOSAL | Status: AC
  Administered 2022-01-08: 15 mL via OROMUCOSAL
  Filled 2022-01-08: qty 15

## 2022-01-08 MED ORDER — SODIUM CHLORIDE (PF) 0.9 % IJ SOLN
INTRAMUSCULAR | Status: DC | PRN
  Administered 2022-01-08: 10 mL
  Administered 2022-01-08: .5 mL

## 2022-01-08 MED ORDER — TRIAMCINOLONE ACETONIDE 40 MG/ML IJ SUSP
INTRAMUSCULAR | Status: DC | PRN
  Administered 2022-01-08: .5 mL

## 2022-01-08 MED ORDER — ACETAZOLAMIDE SODIUM 500 MG IJ SOLR
INTRAMUSCULAR | Status: AC
  Filled 2022-01-08: qty 500

## 2022-01-08 MED ORDER — SODIUM CHLORIDE (PF) 0.9 % IJ SOLN
INTRAMUSCULAR | Status: AC
  Filled 2022-01-08: qty 10

## 2022-01-08 MED ORDER — GATIFLOXACIN 0.5 % OP SOLN OPTIME - NO CHARGE
OPHTHALMIC | Status: DC | PRN
  Administered 2022-01-08: 1 [drp] via OPHTHALMIC

## 2022-01-08 MED ORDER — CARBACHOL 0.01 % IO SOLN
INTRAOCULAR | Status: AC
  Filled 2022-01-08: qty 1.5

## 2022-01-08 MED ORDER — HYDRALAZINE HCL 20 MG/ML IJ SOLN
INTRAMUSCULAR | Status: AC
  Filled 2022-01-08: qty 1

## 2022-01-08 MED ORDER — FENTANYL CITRATE (PF) 250 MCG/5ML IJ SOLN
INTRAMUSCULAR | Status: DC | PRN
Start: 2022-01-08 — End: 2022-01-08
  Administered 2022-01-08: 75 ug via INTRAVENOUS
  Administered 2022-01-08 (×5): 25 ug via INTRAVENOUS
  Administered 2022-01-08: 50 ug via INTRAVENOUS

## 2022-01-08 MED ORDER — LIDOCAINE HCL 1 % IJ SOLN
INTRAMUSCULAR | Status: DC | PRN
  Administered 2022-01-08: 10 mL

## 2022-01-08 MED ORDER — ATROPINE SULFATE 1 % OP SOLN
1.0000 [drp] | OPHTHALMIC | Status: AC | PRN
  Administered 2022-01-08 (×3): 1 [drp] via OPHTHALMIC
  Filled 2022-01-08: qty 5

## 2022-01-08 MED ORDER — ONDANSETRON HCL 4 MG/2ML IJ SOLN: 4.0000 mg | Freq: Once | INTRAMUSCULAR | Status: DC | PRN

## 2022-01-08 MED ORDER — HYDRALAZINE HCL 20 MG/ML IJ SOLN
INTRAMUSCULAR | Status: DC | PRN
  Administered 2022-01-08 (×2): 5 mg via INTRAVENOUS

## 2022-01-08 MED ORDER — NA CHONDROIT SULF-NA HYALURON 40-30 MG/ML IO SOSY
INTRAOCULAR | Status: AC
  Filled 2022-01-08: qty 1

## 2022-01-08 MED ORDER — SUGAMMADEX SODIUM 200 MG/2ML IV SOLN
INTRAVENOUS | Status: DC | PRN
Start: 2022-01-08 — End: 2022-01-08
  Administered 2022-01-08: 200 mg via INTRAVENOUS

## 2022-01-08 MED ORDER — PHENYLEPHRINE HCL 10 % OP SOLN
1.0000 [drp] | OPHTHALMIC | Status: AC | PRN
  Administered 2022-01-08 (×3): 1 [drp] via OPHTHALMIC
  Filled 2022-01-08: qty 5

## 2022-01-08 MED ORDER — LIDOCAINE 2% (20 MG/ML) 5 ML SYRINGE
INTRAMUSCULAR | Status: DC | PRN
  Administered 2022-01-08: 60 mg via INTRAVENOUS

## 2022-01-08 MED ORDER — PROPOFOL 10 MG/ML IV BOLUS
INTRAVENOUS | Status: DC | PRN
  Administered 2022-01-08: 30 mg via INTRAVENOUS
  Administered 2022-01-08: 100 mg via INTRAVENOUS
  Administered 2022-01-08: 30 mg via INTRAVENOUS

## 2022-01-08 MED ORDER — OXYCODONE HCL 5 MG/5ML PO SOLN: 5.0000 mg | Freq: Once | ORAL | Status: DC | PRN

## 2022-01-08 MED ORDER — PREDNISOLONE ACETATE 1 % OP SUSP
OPHTHALMIC | Status: DC | PRN
  Administered 2022-01-08: 1 [drp] via OPHTHALMIC

## 2022-01-08 MED ORDER — AMISULPRIDE (ANTIEMETIC) 5 MG/2ML IV SOLN
INTRAVENOUS | Status: AC
  Filled 2022-01-08: qty 4

## 2022-01-08 MED ORDER — PROPOFOL 10 MG/ML IV BOLUS
INTRAVENOUS | Status: AC
  Filled 2022-01-08: qty 20

## 2022-01-08 MED ORDER — GLYCOPYRROLATE PF 0.2 MG/ML IJ SOSY
PREFILLED_SYRINGE | INTRAMUSCULAR | Status: DC | PRN
  Administered 2022-01-08: .2 mg via INTRAVENOUS

## 2022-01-08 MED ORDER — ROCURONIUM BROMIDE 10 MG/ML (PF) SYRINGE
PREFILLED_SYRINGE | INTRAVENOUS | Status: DC | PRN
  Administered 2022-01-08: 30 mg via INTRAVENOUS
  Administered 2022-01-08: 20 mg via INTRAVENOUS
  Administered 2022-01-08: 50 mg via INTRAVENOUS

## 2022-01-08 MED ORDER — EPINEPHRINE PF 1 MG/ML IJ SOLN
INTRAOCULAR | Status: DC | PRN
  Administered 2022-01-08 (×2): 500.3 mL

## 2022-01-08 MED ORDER — FENTANYL CITRATE (PF) 100 MCG/2ML IJ SOLN: 25.0000 ug | INTRAMUSCULAR | Status: DC | PRN

## 2022-01-08 MED ORDER — BUPIVACAINE HCL (PF) 0.75 % IJ SOLN
INTRAMUSCULAR | Status: AC
  Filled 2022-01-08: qty 10

## 2022-01-08 MED ORDER — 0.9 % SODIUM CHLORIDE (POUR BTL) OPTIME
TOPICAL | Status: DC | PRN
Start: 2022-01-08 — End: 2022-01-08
  Administered 2022-01-08: 200 mL

## 2022-01-08 MED ORDER — ONDANSETRON HCL 4 MG/2ML IJ SOLN
INTRAMUSCULAR | Status: AC
  Filled 2022-01-08: qty 2

## 2022-01-08 MED ORDER — ONDANSETRON HCL 4 MG/2ML IJ SOLN
INTRAMUSCULAR | Status: DC | PRN
  Administered 2022-01-08: 4 mg via INTRAVENOUS

## 2022-01-08 MED ORDER — ATROPINE SULFATE 1 % OP SOLN
OPHTHALMIC | Status: DC | PRN
  Administered 2022-01-08: 1 [drp] via OPHTHALMIC

## 2022-01-08 MED ORDER — LIDOCAINE HCL (PF) 1 % IJ SOLN
INTRAMUSCULAR | Status: AC
  Filled 2022-01-08: qty 5

## 2022-01-08 MED ORDER — DORZOLAMIDE HCL-TIMOLOL MAL 2-0.5 % OP SOLN
OPHTHALMIC | Status: AC
  Filled 2022-01-08: qty 10

## 2022-01-08 MED ORDER — BACITRACIN-POLYMYXIN B 500-10000 UNIT/GM OP OINT
TOPICAL_OINTMENT | OPHTHALMIC | Status: DC | PRN
  Administered 2022-01-08: 1 via OPHTHALMIC

## 2022-01-08 MED ORDER — OXYCODONE HCL 5 MG PO TABS: 5.0000 mg | ORAL_TABLET | Freq: Once | ORAL | Status: DC | PRN

## 2022-01-08 MED ORDER — BRIMONIDINE TARTRATE 0.2 % OP SOLN
OPHTHALMIC | Status: AC
  Filled 2022-01-08: qty 5

## 2022-01-08 MED ORDER — NA CHONDROIT SULF-NA HYALURON 40-30 MG/ML IO SOSY
INTRAOCULAR | Status: DC | PRN
Start: 2022-01-08 — End: 2022-01-08
  Administered 2022-01-08: 0.5 mL via INTRAOCULAR

## 2022-01-08 SURGICAL SUPPLY — 47 items
APPLICATOR COTTON TIP 6 STRL (MISCELLANEOUS) ×4 IMPLANT
APPLICATOR COTTON TIP 6IN STRL (MISCELLANEOUS) ×8
BAG COUNTER SPONGE SURGICOUNT (BAG) ×2 IMPLANT
BAND SCLERAL BUCKLING TYPE 41 (Ophthalmic Related) ×1 IMPLANT
BAND WRIST GAS GREEN (MISCELLANEOUS) IMPLANT
BETADINE 5% OPHTHALMIC (OPHTHALMIC) ×2 IMPLANT
BNDG EYE OVAL (GAUZE/BANDAGES/DRESSINGS) ×1 IMPLANT
CABLE BIPOLOR RESECTION CORD (MISCELLANEOUS) ×1 IMPLANT
CANNULA DUALBORE 25G (CANNULA) ×1 IMPLANT
CANNULA FLEX TIP 25G (CANNULA) ×2 IMPLANT
COVER SURGICAL LIGHT HANDLE (MISCELLANEOUS) ×2 IMPLANT
DRAPE MICROSCOPE LEICA 46X105 (MISCELLANEOUS) ×2 IMPLANT
GAS AUTO FILL CONSTEL (OPHTHALMIC) ×2
GAS AUTO FILL CONSTELLATION (OPHTHALMIC) IMPLANT
GAS WRIST BAND GREEN (MISCELLANEOUS) ×1
GLOVE BIO SURGEON STRL SZ7.5 (GLOVE) ×4 IMPLANT
GLOVE BIOGEL M 7.0 STRL (GLOVE) ×2 IMPLANT
GOWN STRL REUS W/ TWL LRG LVL3 (GOWN DISPOSABLE) ×2 IMPLANT
GOWN STRL REUS W/ TWL XL LVL3 (GOWN DISPOSABLE) ×1 IMPLANT
GOWN STRL REUS W/TWL LRG LVL3 (GOWN DISPOSABLE) ×2
GOWN STRL REUS W/TWL XL LVL3 (GOWN DISPOSABLE) ×1
KIT BASIN OR (CUSTOM PROCEDURE TRAY) ×2 IMPLANT
KIT PERFLUORON PROCEDURE 5ML (MISCELLANEOUS) ×1 IMPLANT
NDL 18GX1X1/2 (RX/OR ONLY) (NEEDLE) ×1 IMPLANT
NDL 25GX 5/8IN NON SAFETY (NEEDLE) ×4 IMPLANT
NEEDLE 18GX1X1/2 (RX/OR ONLY) (NEEDLE) ×2 IMPLANT
NEEDLE 25GX 5/8IN NON SAFETY (NEEDLE) ×8 IMPLANT
NS IRRIG 1000ML POUR BTL (IV SOLUTION) ×2 IMPLANT
OPHTHALMIC BETADINE 5% (OPHTHALMIC) ×2
PACK VITRECTOMY CUSTOM (CUSTOM PROCEDURE TRAY) ×2 IMPLANT
PAD ARMBOARD 7.5X6 YLW CONV (MISCELLANEOUS) ×4 IMPLANT
PAK PIK VITRECTOMY CVS 25GA (OPHTHALMIC) ×2 IMPLANT
PROBE ENDO DIATHERMY 25G (MISCELLANEOUS) ×1 IMPLANT
PROBE LASER ILLUM FLEX CVD 25G (OPHTHALMIC) ×1 IMPLANT
SHIELD EYE LENSE ONLY DISP (GAUZE/BANDAGES/DRESSINGS) ×1 IMPLANT
SUT ETHILON 5.0 S-24 (SUTURE) ×2 IMPLANT
SUT SILK 2 0 (SUTURE) ×1
SUT SILK 2 0 TIES 17X18 (SUTURE) ×1
SUT SILK 2-0 18XBRD TIE 12 (SUTURE) ×1 IMPLANT
SUT SILK 2-0 18XBRD TIE BLK (SUTURE) ×1 IMPLANT
SUT VICRYL 7 0 TG140 8 (SUTURE) ×2 IMPLANT
SYR 10ML LL (SYRINGE) ×2 IMPLANT
SYR 20ML LL LF (SYRINGE) ×2 IMPLANT
SYR 5ML LL (SYRINGE) ×2 IMPLANT
SYR TB 1ML LUER SLIP (SYRINGE) ×2 IMPLANT
TOWEL GREEN STERILE FF (TOWEL DISPOSABLE) ×2 IMPLANT
WATER STERILE IRR 1000ML POUR (IV SOLUTION) ×2 IMPLANT

## 2022-01-08 NOTE — Transfer of Care (Signed)
Immediate Anesthesia Transfer of Care Note  Patient: Melody Freeman  Procedure(s) Performed: TWENTY-FIVE GAUGE PARS PLANA VITRECTOMY WITH SCLERAL BUCKLE (Left: Eye) PERFLUORON INJECTION (Left: Eye) PHOTOCOAGULATION WITH LASER (Left: Eye) AIR/FLUID EXCHANGE (Left: Eye) INSERTION OF GAS C3F8 (Left: Eye)  Patient Location: PACU  Anesthesia Type:General  Level of Consciousness: awake, drowsy and patient cooperative  Airway & Oxygen Therapy: Patient Spontanous Breathing and Patient connected to face mask oxygen  Post-op Assessment: Report given to RN, Post -op Vital signs reviewed and stable and Patient moving all extremities X 4  Post vital signs: Reviewed and stable  Last Vitals:  Vitals Value Taken Time  BP 150/74 01/08/22 1538  Temp    Pulse 93 01/08/22 1539  Resp 19 01/08/22 1539  SpO2 99 % 01/08/22 1539  Vitals shown include unvalidated device data.  Last Pain:  Vitals:   01/08/22 0951  TempSrc:   PainSc: 0-No pain         Complications: No notable events documented.

## 2022-01-08 NOTE — Discharge Instructions (Addendum)
POSTOPERATIVE INSTRUCTIONS ° °Your doctor has performed vitreoretinal surgery on you at [facility]. ° °- Keep eye patched and shielded until seen by Dr. Tonique Mendonca 815 AM tomorrow in clinic °- Do not use drops until return °- FACE DOWN POSITIONING WHILE AWAKE °- Sleep with belly down or on right side, avoid laying flat on back.   ° °- No strenuous bending, stooping or lifting. ° °- You may not drive until further notice. ° °- If your doctor used a gas bubble in your eye during the procedure he will advise you on postoperative positioning. If you have a gas bubble you will be wearing a green bracelet that was applied in the operating room. The green bracelet should stay on as long as the gas bubble is in your eye. While the gas bubble is present you should not fly in an airplane. If you require general anesthesia while the gas bubble is present you must notify your anesthesiologist that an intraocular gas bubble is present so he can take the appropriate precautions. ° °- Tylenol or any other over-the-counter pain reliever can be used according to your doctor. If more pain medicine is required, your doctor will have a prescription for you. ° °- You may read, go up and down stairs, and watch television. ° ° ° ° Tomisha Reppucci, M.D., Ph.D. ° °

## 2022-01-08 NOTE — Anesthesia Postprocedure Evaluation (Signed)
Anesthesia Post Note  Patient: Melody Freeman  Procedure(s) Performed: TWENTY-FIVE GAUGE PARS PLANA VITRECTOMY WITH SCLERAL BUCKLE (Left: Eye) PERFLUORON INJECTION (Left: Eye) PHOTOCOAGULATION WITH LASER (Left: Eye) AIR/FLUID EXCHANGE (Left: Eye) INSERTION OF GAS C3F8 (Left: Eye)     Patient location during evaluation: PACU Anesthesia Type: General Level of consciousness: awake and alert, oriented and patient cooperative Pain management: pain level controlled Vital Signs Assessment: post-procedure vital signs reviewed and stable Respiratory status: spontaneous breathing, nonlabored ventilation and respiratory function stable Cardiovascular status: blood pressure returned to baseline and stable Postop Assessment: no apparent nausea or vomiting Anesthetic complications: no Comments: Repeat EKG in PACU very similar to preop, no acute signs of cardiac issues   No notable events documented.  Last Vitals:  Vitals:   01/08/22 1538 01/08/22 1553  BP: (!) 150/74 (!) 145/72  Pulse: 93 95  Resp: (!) 24 18  Temp: (!) 36.1 C   SpO2: 98% 97%    Last Pain:  Vitals:   01/08/22 1538  TempSrc:   PainSc: 0-No pain                 Lannie Fields

## 2022-01-08 NOTE — Anesthesia Procedure Notes (Signed)
Procedure Name: Intubation Date/Time: 01/08/2022 12:21 PM Performed by: Rande Brunt, CRNA Pre-anesthesia Checklist: Patient identified, Emergency Drugs available, Suction available and Patient being monitored Patient Re-evaluated:Patient Re-evaluated prior to induction Oxygen Delivery Method: Circle System Utilized Preoxygenation: Pre-oxygenation with 100% oxygen Induction Type: IV induction Ventilation: Mask ventilation without difficulty Laryngoscope Size: Mac and 3 Grade View: Grade I Tube type: Oral Tube size: 7.0 mm Number of attempts: 1 Airway Equipment and Method: Stylet Placement Confirmation: ETT inserted through vocal cords under direct vision, positive ETCO2 and breath sounds checked- equal and bilateral Secured at: 21 cm Tube secured with: Tape Dental Injury: Teeth and Oropharynx as per pre-operative assessment

## 2022-01-08 NOTE — Anesthesia Preprocedure Evaluation (Addendum)
Anesthesia Evaluation  Patient identified by MRN, date of birth, ID band Patient awake    Reviewed: Allergy & Precautions, NPO status , Patient's Chart, lab work & pertinent test results  History of Anesthesia Complications (+) PONV and history of anesthetic complications  Airway Mallampati: IV  TM Distance: >3 FB Neck ROM: Full    Dental  (+) Edentulous Upper, Edentulous Lower   Pulmonary neg pulmonary ROS,    Pulmonary exam normal breath sounds clear to auscultation       Cardiovascular hypertension (149/77 in preop, no history of HTN per pt), Normal cardiovascular exam Rhythm:Regular Rate:Normal     Neuro/Psych PSYCHIATRIC DISORDERS Depression negative neurological ROS     GI/Hepatic Neg liver ROS, GERD  Medicated and Controlled,  Endo/Other  diabetes, Well Controlled, Type 2, Oral Hypoglycemic Agentsfs 94 in preop  Renal/GU negative Renal ROS  negative genitourinary   Musculoskeletal  (+) Arthritis , Osteoarthritis,    Abdominal   Peds  Hematology negative hematology ROS (+)   Anesthesia Other Findings L eye retinal detachment   Reproductive/Obstetrics negative OB ROS                            Anesthesia Physical Anesthesia Plan  ASA: 2  Anesthesia Plan: General   Post-op Pain Management: Tylenol PO (pre-op)*   Induction: Intravenous  PONV Risk Score and Plan: 4 or greater and Ondansetron, Dexamethasone and Treatment may vary due to age or medical condition  Airway Management Planned: Oral ETT  Additional Equipment: None  Intra-op Plan:   Post-operative Plan: Extubation in OR  Informed Consent: I have reviewed the patients History and Physical, chart, labs and discussed the procedure including the risks, benefits and alternatives for the proposed anesthesia with the patient or authorized representative who has indicated his/her understanding and acceptance.     Dental  advisory given  Plan Discussed with: CRNA  Anesthesia Plan Comments:        Anesthesia Quick Evaluation

## 2022-01-08 NOTE — Interval H&P Note (Signed)
History and Physical Interval Note:  01/08/2022 11:45 AM  Melody Freeman  has presented today for surgery, with the diagnosis of left eye retinal detachment.  The various methods of treatment have been discussed with the patient and family. After consideration of risks, benefits and other options for treatment, the patient has consented to  Procedure(s): VITRECTOMY 25 GAUGE WITH SCLERAL BUCKLE (Left) as a surgical intervention.  The patient's history has been reviewed, patient examined, no change in status, stable for surgery.  I have reviewed the patient's chart and labs.  Questions were answered to the patient's satisfaction.     [name]

## 2022-01-08 NOTE — OR Nursing (Signed)
Green gas bracelet place on left arm

## 2022-01-08 NOTE — Op Note (Signed)
Date of procedure: 05.18.23   Surgeon: [name], MD, PhD   Assistant: Laurian Brim, Ophthalmic Assistant    Pre-operative Diagnosis: Macula involving Rhegmatogenous Retinal Detachment, Left Eye   Post-operative diagnosis: Macula involving Rhegmatogenous Retinal Detachment, Left Eye   Anesthesia: GETA   Procedures: 1)     Scleral Buckle, Left Eye 2)     25 gauge pars plana vitrectomy, Left Eye CPT [phone] 3)     Perfluorocarbon injection 4)     Fluid-air exchange, Left Eye 5)     Endolaser, Left Eye 5)     Injection of 14% C3F8 gas   Complications: none Estimated blood loss: minimal Specimens: none   Brief history:   The patient has a history of decreased vision in the affected left eye, and on examination, was noted to have a macula-involving retinal detachment, affecting activities of daily living.  The risks, benefits, and alternatives were explained to the patient, including pain, bleeding, infection, loss of vision, double vision, droopy eyelids, and need for more surgeries.  Informed consent was obtained from the patient and placed in the chart.     Procedure:             The patient was brought to the preoperative holding area where the correct eye was confirmed and marked.  The patient was then brought to the operating room where general endotracheal anesthesia was induced. A secondary time-out was performed to identify the correct patient, eyes, procedures, and any allergies. The left eye was prepped and draped in the usual sterile ophthalmic fashion followed by placement of a lid speculum.             A 360 conjunctival peritomy was created using Westcott scissors and 0.12 forceps. Each of the four quadrants between the rectus muscles was dissected using Stevens scissors to detach Tenon's attachments from the globe. Each of the four rectus muscles was isolated on a muscle hook and slung using 2-0 Silk suture in the usual standard fashion. Each of the four quadrants between  the rectus muscles was inspected and there were noted to be no areas of scleral thinning. A #41 silicone band was then brought onto the field and was threaded under each rectus muscle. The band was then loosely secured using a #70 Watzke sleeve in the superotemporal quadrant. The band was then sutured to the sclera in each quadrant using 5-0 nylon sutures passed partial thickness through the sclera in a horizontal mattress fashion. The scleral buckle was then tightened to the appropriate height with two locking needle drivers. Attention was then turned to the vitrectomy portion of the procedure.              A 25 gauge trocar was placed in the inferotemporal quadrant in a beveled fashion. A 4 mm infusion cannula was placed through this trocar, and the infusion cannula was confirmed in the vitreous cavity with no incarceration of retina or choroid prior to turning it on. Two additional 25 gauge trocars were placed in the superonasal and superotemporal quadrants (2 and 10 oclock, respectively) in a similar beveled fashion. At this time, a standard three-port pars plana vitrectomy was performed using the light pipe, the cutter, and the BIOM viewing system. A thorough anterior, core and peripheral vitreous dissection was performed. A posterior vitreous detachment was confirmed over the optic nerve. There was a temporal retinal detachment involving the macula and fovea spanning 12 to 5 oclock. There were three retinal tears spanning the 1-2 oclock hour  positions.             Traction was removed from all retinal breaks. The breaks were trimmed using the cutter to smooth the edges and marked with diathermy. Perfluoron was injected to push the subretinal fluid anterior to the scleral buckle. Under perfluoron, endolaser was applied over the posterior edge of the scleral buckle and just posterior to the buckle. Then, a complete fluid-air exchange was performed with a soft tip extrusion cannula over the breaks, then  posteriorly to remove the perfluoron. After completion of these maneuvers, the retina was flat over the macula and over the scleral buckle. Under air, endolaser was applied to all the breaks and over and anterior to the scleral buckle.             At this time, the buckle height was confirmed and the buckle was finalized by trimming the band ends. The superotemporal trocar was removed and sutured with 7-0 vicryl in an interrupted fashion.  A complete air to 14% C3F8 gas exchange was performed through the infusion cannula and vented through the superonasal trocar using the extrusion cannula. The superonasal trocar and infusion cannula and associated trocar were then removed and sutured with 7-0 vicryl in an interrupted fashion. Kefzol + polymixin irrigation was then used over the buckle. A subtenon's block containing 0.75% marcaine and 2% lidocaine was administered.              The conjunctiva was closed with 7-0 vicryl sutures. The eye's intraocular pressure was confirmed to be at a physiologic level by digital palpation. Subconjunctival injections of Antibiotic and kenalog were administered. The lid speculum and drapes were removed. Drops of an antibiotic, antihypertensives, and steroid were given. Copious antibiotic ointment was instilled into the eye. The eye was patched and shielded. The patient tolerated the procedure well without any intraoperative or immediate postoperative complications. The patient was taken to the recovery room in good condition. The patient was instructed to maintain a strict face-down position and will be seen by Dr. Vanessa Barbara tomorrow morning in clinic.

## 2022-01-08 NOTE — Brief Op Note (Signed)
01/08/2022  3:43 PM  PATIENT:  Melody Freeman  71 y.o. female  PRE-OPERATIVE DIAGNOSIS:  left eye retinal detachment  POST-OPERATIVE DIAGNOSIS:  left eye retinal detachment  PROCEDURE:  Procedure(s): TWENTY-FIVE GAUGE PARS PLANA VITRECTOMY WITH SCLERAL BUCKLE (Left) PERFLUORON INJECTION (Left) PHOTOCOAGULATION WITH LASER (Left) AIR/FLUID EXCHANGE (Left) INSERTION OF GAS C3F8 (Left)  SURGEON:  Surgeon(s) and Role:    [name], MD - Primary  ASSISTANTS: Laurian Brim, Ophthalmic Assistant    ANESTHESIA:   local and general  EBL:  minimal   BLOOD ADMINISTERED:none  DRAINS: none   LOCAL MEDICATIONS USED:  BUPIVICAINE , LIDOCAINE , and Amount: 10 ml  SPECIMEN:  No Specimen  DISPOSITION OF SPECIMEN:  N/A  COUNTS:  YES  TOURNIQUET:  * No tourniquets in log *  DICTATION: .Note written in EPIC  PLAN OF CARE: Discharge to home after PACU  PATIENT DISPOSITION:  PACU - hemodynamically stable.   Delay start of Pharmacological VTE agent (>24hrs) due to surgical blood loss or risk of bleeding: not applicable

## 2022-01-09 ENCOUNTER — Encounter (INDEPENDENT_AMBULATORY_CARE_PROVIDER_SITE_OTHER): Payer: Self-pay | Admitting: Ophthalmology

## 2022-01-09 ENCOUNTER — Ambulatory Visit (INDEPENDENT_AMBULATORY_CARE_PROVIDER_SITE_OTHER): Payer: Medicare Other | Admitting: Ophthalmology

## 2022-01-09 DIAGNOSIS — H25813 Combined forms of age-related cataract, bilateral: Secondary | ICD-10-CM

## 2022-01-09 DIAGNOSIS — H3322 Serous retinal detachment, left eye: Secondary | ICD-10-CM

## 2022-01-09 DIAGNOSIS — E119 Type 2 diabetes mellitus without complications: Secondary | ICD-10-CM

## 2022-01-12 ENCOUNTER — Encounter (INDEPENDENT_AMBULATORY_CARE_PROVIDER_SITE_OTHER): Payer: Self-pay | Admitting: Ophthalmology

## 2022-01-13 NOTE — Progress Notes (Signed)
[facility] Note  01/15/2022     CHIEF COMPLAINT Patient presents for Retina Follow Up   HISTORY OF PRESENT ILLNESS: Melody Freeman is a 71 y.o. female who presents to the clinic today for:   HPI     Retina Follow Up   Patient presents with  Retinal Break/Detachment.  In left eye.  Severity is moderate.  Duration of 6 days.  Since onset it is gradually improving.  I, the attending physician,  performed the HPI with the patient and updated documentation appropriately.        Comments   Pt here for 6 day f/u RD OS. Pt states VA is stably improving, shapes and lights are becoming more apparent. Last round of gtts administered at 645AM.       Last edited by Bernarda Caffey, MD on 01/15/2022 11:50 AM.     Pt states   Referring physician: Pickens Nation, MD [place],  [place] [address]  HISTORICAL INFORMATION:   Selected notes from the MEDICAL RECORD NUMBER Referred by Dr. Marin Comment for concern of RD OS LEE:  Ocular Hx- PMH-    CURRENT MEDICATIONS: Current Outpatient Medications (Ophthalmic Drugs)  Medication Sig   bacitracin-polymyxin b (POLYSPORIN) ophthalmic ointment Place into the left eye 4 (four) times daily for 10 days. Place a 1/2 inch ribbon of ointment into the lower eyelid.   prednisoLONE acetate (PRED FORTE) 1 % ophthalmic suspension Place 1 drop into the left eye every 2 (two) hours while awake.   No current facility-administered medications for this visit. (Ophthalmic Drugs)   Current Outpatient Medications (Other)  Medication Sig   acetaminophen (TYLENOL) 500 MG tablet Take 1,000 mg by mouth every 6 (six) hours as needed.   HYDROcodone-acetaminophen (NORCO/VICODIN) 5-325 MG tablet Take 1 tablet by mouth every 4 (four) hours as needed for moderate pain.   ibuprofen (ADVIL) 200 MG tablet Take 600 mg by mouth every 6 (six) hours as needed for headache or moderate pain.   metFORMIN (GLUCOPHAGE-XR) 500 MG 24 hr tablet Take  1,000 mg by mouth 2 (two) times daily.   omeprazole (PRILOSEC) 20 MG capsule Take 20 mg by mouth daily as needed (acid reflux).   rosuvastatin (CRESTOR) 10 MG tablet Take 10 mg by mouth daily.   sertraline (ZOLOFT) 50 MG tablet Take 50 mg by mouth daily.   No current facility-administered medications for this visit. (Other)   REVIEW OF SYSTEMS: ROS   Positive for: Eyes Negative for: Constitutional, Gastrointestinal, Neurological, Skin, Genitourinary, Musculoskeletal, HENT, Endocrine, Cardiovascular, Respiratory, Psychiatric, Allergic/Imm, Heme/Lymph Last edited by Kingsley Spittle, COT on 01/15/2022  7:53 AM.     ALLERGIES Allergies  Allergen Reactions   Codeine Nausea And Vomiting   PAST MEDICAL HISTORY Past Medical History:  Diagnosis Date   Arthritis    Depression    Diabetes mellitus without complication ([place])    GERD (gastroesophageal reflux disease)    PONV (postoperative nausea and vomiting)    Past Surgical History:  Procedure Laterality Date   ABDOMINAL HYSTERECTOMY     AIR/FLUID EXCHANGE Left 01/08/2022   Procedure: AIR/FLUID EXCHANGE;  Surgeon: Bernarda Caffey, MD;  Location: [facility];  Service: Ophthalmology;  Laterality: Left;   CHOLECYSTECTOMY     GAS INSERTION Left 01/08/2022   Procedure: INSERTION OF GAS C3F8;  Surgeon: Bernarda Caffey, MD;  Location: [facility];  Service: Ophthalmology;  Laterality: Left;   PERFLUORONE INJECTION Left 01/08/2022   Procedure: PERFLUORON INJECTION;  Surgeon: Bernarda Caffey, MD;  Location: [facility];  Service: Ophthalmology;  Laterality: Left;   PHOTOCOAGULATION WITH LASER Left 01/08/2022   Procedure: PHOTOCOAGULATION WITH LASER;  Surgeon: Bernarda Caffey, MD;  Location: [facility];  Service: Ophthalmology;  Laterality: Left;   VITRECTOMY 25 GAUGE WITH SCLERAL BUCKLE Left 01/08/2022   Procedure: TWENTY-FIVE GAUGE PARS PLANA VITRECTOMY WITH SCLERAL BUCKLE;  Surgeon: Bernarda Caffey, MD;  Location: [facility];  Service: Ophthalmology;  Laterality: Left;   FAMILY  HISTORY History reviewed. No pertinent family history.  SOCIAL HISTORY Social History   Tobacco Use   Smoking status: Never   Smokeless tobacco: Never  Vaping Use   Vaping Use: Never used  Substance Use Topics   Alcohol use: Never   Drug use: Never       OPHTHALMIC EXAM:  Base Eye Exam     Visual Acuity (Snellen - Linear)       Right Left   Dist cc 20/25 HM   Dist ph cc  NI    Correction: Glasses         Tonometry (Tonopen, 8:00 AM)       Right Left   Pressure 12 11         Pupils       Dark Light Shape React APD   Right 4 3 Round Brisk None   Left dilated             Visual Fields       Left Right     Full   Restrictions Partial outer superior temporal, inferior temporal, superior nasal, inferior nasal deficiencies          Extraocular Movement       Right Left    Full, Ortho Full, Ortho         Neuro/Psych     Oriented x3: Yes   Mood/Affect: Normal         Dilation     Left eye: 1.0% Mydriacyl, 2.5% Phenylephrine @ 8:01 AM           Slit Lamp and Fundus Exam     Slit Lamp Exam       Right Left   Lids/Lashes Dermatochalasis - upper lid, mild MGD Dermatochalasis - upper lid, mild MGD, lid edema   Conjunctiva/Sclera White and quiet Subconjunctival hemorrhage - improving, sutures intact   Cornea mild arcus, trace PEE Central epi defect, mild haze, 1+ Punctate epithelial erosions, tear film debris   Anterior Chamber Deep and quiet Deep, narrow angles, 2-3+ pigment, small, round fibrin sheet superiorly (2.54m in diameter)   Iris Round and dilated, No NVI Round and dilated, No NVI   Lens 2+ Nuclear sclerosis, 2+ Cortical cataract 2+ Nuclear sclerosis, 2+ Cortical cataract, 2+ PC feathering / PSC   Anterior Vitreous Vitreous syneresis post vitrectomy, good gas fill         Fundus Exam       Right Left   Disc  perfused   C/D Ratio 0.3 0.3   Macula  Hazy view, flat under gas   Vessels  mild attenuation   Periphery   Attached over buckle, good buckle height, good laser over buckle and around tears, PRE-OP: Bullous ST detachment from 1100-0400 with small holes at 1200           Refraction     Wearing Rx       Sphere Cylinder Axis Add   Right +1.25 +1.00 015 +2.25   Left +1.50 Sphere  +2.25  IMAGING AND PROCEDURES  Imaging and Procedures for 01/15/2022          ASSESSMENT/PLAN:    ICD-10-CM   1. Left retinal detachment  H33.22     2. Diabetes mellitus type 2 without retinopathy ([place])  E11.9     3. 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, onset of foveal involvement about 1-2 week ago by pt history - detached from 11 to 4 oclock, fovea off, microtears ~130 oclock within areas of very thin retina - POW1 s/p PPV/POC/EL/FAX/14% C3F8 OS, 05.18.2023             - doing well             - retina attached and in good position -- good buckle height and laser around breaks             - IOP okay at 11             - cont   PF q2h OS -- increase to q1h                         zymaxid QID OS                          Atropine BID OS                          PSO ung QID OS              - cont face down positioning 30 min/hr; avoid laying flat on back              - eye shield when sleeping x1 more week             - post op drop and positioning instructions reviewed              - tylenol/ibuprofen for pain              - Rx given for breakthrough pain  - f/u next Thursday (1 wk, POW2)  2. Diabetes mellitus, type 2 without retinopathy - The incidence, risk factors for progression, natural history and treatment options for diabetic retinopathy  were discussed with patient.   - The need for close monitoring of blood glucose, blood pressure, and serum lipids, avoiding cigarette or any type of tobacco, and the need for long term follow up was also discussed with patient.  - f/u in 1 year,  sooner prn  3. Mixed Cataract OU - The symptoms of cataract, surgical options, and treatments and risks were discussed with patient. - discussed diagnosis and progression -- specifically post vitrectomy progression - monitor  Ophthalmic Meds Ordered this visit:  Meds ordered this encounter  Medications   prednisoLONE acetate (PRED FORTE) 1 % ophthalmic suspension    Sig: Place 1 drop into the left eye every 2 (two) hours while awake.    Dispense:  15 mL    Refill:  0   bacitracin-polymyxin b (POLYSPORIN) ophthalmic ointment    Sig: Place into the left eye 4 (four) times daily for 10 days. Place a 1/2 inch ribbon of ointment into the lower eyelid.    Dispense:  3.5 g    Refill:  3     Return in about 1  week (around 01/22/2022) for f/u RD OS, DFE.  There are no Patient Instructions on file for this visit.  Explained the diagnoses, plan, and follow up with the patient and they expressed understanding.  Patient expressed understanding of the importance of proper follow up care.   This document serves as a record of services personally performed by Gardiner Sleeper, MD, PhD. It was created on their behalf by San Jetty. Owens Shark, OA an ophthalmic technician. The creation of this record is the provider's dictation and/or activities during the visit.    Electronically signed by: San Jetty. Owens Shark, [place] 05.23.2023 11:51 AM  Gardiner Sleeper, M.D., Ph.D. Diseases & Surgery of the Retina and Vitreous [facility]  I have reviewed the above documentation for accuracy and completeness, and I agree with the above. Gardiner Sleeper, M.D., Ph.D. 01/15/22 11:54 AM  Abbreviations: M myopia (nearsighted); A astigmatism; H hyperopia (farsighted); P presbyopia; Mrx spectacle prescription;  CTL contact lenses; OD right eye; OS left eye; OU both eyes  XT exotropia; ET esotropia; PEK punctate epithelial keratitis; PEE punctate epithelial erosions; DES dry eye syndrome; MGD meibomian gland  dysfunction; ATs artificial tears; PFAT's preservative free artificial tears; [place] nuclear sclerotic cataract; PSC posterior subcapsular cataract; ERM epi-retinal membrane; PVD posterior vitreous detachment; RD retinal detachment; DM diabetes mellitus; DR diabetic retinopathy; NPDR non-proliferative diabetic retinopathy; PDR proliferative diabetic retinopathy; CSME clinically significant macular edema; DME diabetic macular edema; dbh dot blot hemorrhages; CWS cotton wool spot; POAG primary open angle glaucoma; C/D cup-to-disc ratio; HVF humphrey visual field; GVF goldmann visual field; OCT optical coherence tomography; IOP intraocular pressure; BRVO Branch retinal vein occlusion; CRVO central retinal vein occlusion; CRAO central retinal artery occlusion; BRAO branch retinal artery occlusion; RT retinal tear; SB scleral buckle; PPV pars plana vitrectomy; VH Vitreous hemorrhage; PRP panretinal laser photocoagulation; IVK intravitreal kenalog; VMT vitreomacular traction; MH Macular hole;  NVD neovascularization of the disc; NVE neovascularization elsewhere; AREDS age related eye disease study; ARMD age related macular degeneration; POAG primary open angle glaucoma; EBMD epithelial/anterior basement membrane dystrophy; ACIOL anterior chamber intraocular lens; IOL intraocular lens; PCIOL posterior chamber intraocular lens; Phaco/IOL phacoemulsification with intraocular lens placement; [place] photorefractive keratectomy; LASIK laser assisted in situ keratomileusis; HTN hypertension; DM diabetes mellitus; COPD chronic obstructive pulmonary disease

## 2022-01-15 ENCOUNTER — Ambulatory Visit (INDEPENDENT_AMBULATORY_CARE_PROVIDER_SITE_OTHER): Payer: Medicare Other | Admitting: Ophthalmology

## 2022-01-15 ENCOUNTER — Encounter (INDEPENDENT_AMBULATORY_CARE_PROVIDER_SITE_OTHER): Payer: Self-pay | Admitting: Ophthalmology

## 2022-01-15 DIAGNOSIS — H25813 Combined forms of age-related cataract, bilateral: Secondary | ICD-10-CM

## 2022-01-15 DIAGNOSIS — H3322 Serous retinal detachment, left eye: Secondary | ICD-10-CM

## 2022-01-15 DIAGNOSIS — E119 Type 2 diabetes mellitus without complications: Secondary | ICD-10-CM

## 2022-01-15 MED ORDER — PREDNISOLONE ACETATE 1 % OP SUSP: 1.0000 [drp] | OPHTHALMIC | 0 refills | Status: DC

## 2022-01-15 MED ORDER — BACITRACIN-POLYMYXIN B 500-10000 UNIT/GM OP OINT: TOPICAL_OINTMENT | Freq: Four times a day (QID) | OPHTHALMIC | 3 refills | Status: AC

## 2022-01-16 NOTE — Progress Notes (Signed)
[name] Note  01/22/2022     CHIEF COMPLAINT Patient presents for Retina Follow Up   HISTORY OF PRESENT ILLNESS: Melody Freeman is a 71 y.o. female who presents to the clinic today for:   HPI     Retina Follow Up   Patient presents with  Retinal Break/Detachment (RD OS s/p PPV/POV/EL/FAX/C3F8 (05.18.23)).  In left eye.  This started weeks ago.  Severity is moderate.  Duration of 1 week.  Since onset it is gradually improving.  I, the attending physician,  performed the HPI with the patient and updated documentation appropriately.        Comments   Patient states that she is starting to be able to see her hand if she looks down. She is using PF OS Q1H, d/c Zymaxid on Monday, Atropine OS BID, and PSO ung OS QID. She is positioning as instructed.       Last edited by Bernarda Caffey, MD on 01/22/2022  9:02 AM.    Pt states she is hanging in there, she is maintaining face down positioning, she has been using Atropine more than PF bc they misread the instructions, but they have started using PF more now  Referring physician: Robertson Nation, MD [place],  [place] [address]  HISTORICAL INFORMATION:   Selected notes from the MEDICAL RECORD NUMBER Referred by Dr. Marin Comment for concern of RD OS LEE:  Ocular Hx- PMH-    CURRENT MEDICATIONS: Current Outpatient Medications (Ophthalmic Drugs)  Medication Sig   bacitracin-polymyxin b (POLYSPORIN) ophthalmic ointment Place into the left eye 4 (four) times daily for 10 days. Place a 1/2 inch ribbon of ointment into the lower eyelid.   prednisoLONE acetate (PRED FORTE) 1 % ophthalmic suspension Place 1 drop into the left eye every 2 (two) hours while awake.   No current facility-administered medications for this visit. (Ophthalmic Drugs)   Current Outpatient Medications (Other)  Medication Sig   acetaminophen (TYLENOL) 500 MG tablet Take 1,000 mg by mouth every 6 (six) hours as needed.    HYDROcodone-acetaminophen (NORCO/VICODIN) 5-325 MG tablet Take 1 tablet by mouth every 4 (four) hours as needed for moderate pain.   ibuprofen (ADVIL) 200 MG tablet Take 600 mg by mouth every 6 (six) hours as needed for headache or moderate pain.   metFORMIN (GLUCOPHAGE-XR) 500 MG 24 hr tablet Take 1,000 mg by mouth 2 (two) times daily.   omeprazole (PRILOSEC) 20 MG capsule Take 20 mg by mouth daily as needed (acid reflux).   rosuvastatin (CRESTOR) 10 MG tablet Take 10 mg by mouth daily.   sertraline (ZOLOFT) 50 MG tablet Take 50 mg by mouth daily.   No current facility-administered medications for this visit. (Other)   REVIEW OF SYSTEMS: ROS   Positive for: Eyes Negative for: Constitutional, Gastrointestinal, Neurological, Skin, Genitourinary, Musculoskeletal, HENT, Endocrine, Cardiovascular, Respiratory, Psychiatric, Allergic/Imm, Heme/Lymph Last edited by [name], COT on 01/22/2022  8:18 AM.      ALLERGIES Allergies  Allergen Reactions   Codeine Nausea And Vomiting   PAST MEDICAL HISTORY Past Medical History:  Diagnosis Date   Arthritis    Depression    Diabetes mellitus without complication ([place])    GERD (gastroesophageal reflux disease)    PONV (postoperative nausea and vomiting)    Past Surgical History:  Procedure Laterality Date   ABDOMINAL HYSTERECTOMY     AIR/FLUID EXCHANGE Left 01/08/2022   Procedure: AIR/FLUID EXCHANGE;  Surgeon: Bernarda Caffey, MD;  Location: [facility];  Service: Ophthalmology;  Laterality: Left;   CHOLECYSTECTOMY     GAS INSERTION Left 01/08/2022   Procedure: INSERTION OF GAS C3F8;  Surgeon: Bernarda Caffey, MD;  Location: [facility];  Service: Ophthalmology;  Laterality: Left;   PERFLUORONE INJECTION Left 01/08/2022   Procedure: PERFLUORON INJECTION;  Surgeon: Bernarda Caffey, MD;  Location: [facility];  Service: Ophthalmology;  Laterality: Left;   PHOTOCOAGULATION WITH LASER Left 01/08/2022   Procedure: PHOTOCOAGULATION WITH LASER;  Surgeon: Bernarda Caffey, MD;  Location: [facility];  Service: Ophthalmology;  Laterality: Left;   VITRECTOMY 25 GAUGE WITH SCLERAL BUCKLE Left 01/08/2022   Procedure: TWENTY-FIVE GAUGE PARS PLANA VITRECTOMY WITH SCLERAL BUCKLE;  Surgeon: Bernarda Caffey, MD;  Location: [facility];  Service: Ophthalmology;  Laterality: Left;   FAMILY HISTORY History reviewed. No pertinent family history.  SOCIAL HISTORY Social History   Tobacco Use   Smoking status: Never   Smokeless tobacco: Never  Vaping Use   Vaping Use: Never used  Substance Use Topics   Alcohol use: Never   Drug use: Never       OPHTHALMIC EXAM:  Base Eye Exam     Visual Acuity (Snellen - Linear)       Right Left   Dist cc 20/25 HM    Correction: Glasses         Tonometry (Tonopen, 8:22 AM)       Right Left   Pressure 17 14         Pupils       Dark Light Shape React APD   Right 4 3 Round Brisk None   Left Dilated             Visual Fields       Left Right     Full   Restrictions Partial outer superior temporal, inferior temporal, superior nasal, inferior nasal deficiencies          Extraocular Movement       Right Left    Full, Ortho Full, Ortho         Neuro/Psych     Oriented x3: Yes   Mood/Affect: Normal         Dilation     Left eye: 1.0% Mydriacyl, 2.5% Phenylephrine @ 8:19 AM           Slit Lamp and Fundus Exam     Slit Lamp Exam       Right Left   Lids/Lashes Dermatochalasis - upper lid, mild MGD Dermatochalasis - upper lid, mild MGD, lid edema   Conjunctiva/Sclera White and quiet Subconjunctival hemorrhage - improving, sutures intact   Cornea mild arcus, trace PEE 1+ fine Punctate epithelial erosions, tear film debris   Anterior Chamber Deep and quiet Deep, narrow angles, 0.5+cell, fibrin rxn resolved   Iris Round and dilated, No NVI irregular pupil, No NVI   Lens 2+ Nuclear sclerosis, 2+ Cortical cataract 2+ Nuclear sclerosis, 2+ Cortical cataract, 3+ PC feathering / PSC   Anterior  Vitreous Vitreous syneresis post vitrectomy, good gas fill         Fundus Exam       Right Left   Disc  Pink and Sharp   C/D Ratio 0.3 0.3   Macula  flat under gas, Good foveal reflex   Vessels  mild attenuation   Periphery  Attached over buckle, good buckle height, good laser over buckle and around tears, PRE-OP: Bullous ST detachment from 1100-0400 with small holes at 1200  Refraction     Wearing Rx       Sphere Cylinder Axis Add   Right +1.25 +1.00 015 +2.25   Left +1.50 Sphere  +2.25           IMAGING AND PROCEDURES  Imaging and Procedures for 01/22/2022          ASSESSMENT/PLAN:    ICD-10-CM   1. Left retinal detachment  H33.22     2. Diabetes mellitus type 2 without retinopathy ([place])  E11.9     3. 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, onset of foveal involvement about 1-2 week ago by pt history - detached from 11 to 4 oclock, fovea off, microtears ~130 oclock within areas of very thin retina - POW2 s/p PPV/POC/EL/FAX/14% C3F8 OS, 05.18.2023             - doing well             - retina attached and in good position -- good buckle height and laser around breaks  - fibrin rxn resolved -- pt had switched PF and atropine drop instructions             - IOP okay at 11             - cont   PF q2h OS -- decrease to QID                         Atropine BID OS -- okay to stop                         PSO ung QID OS -- use at bedtime and PRN             - cont face down positioning 30 min/hr; avoid laying flat on back              - post op drop and positioning instructions reviewed              - tylenol/ibuprofen for pain              - Rx given for breakthrough pain  - f/u 2-3 weeks, DFE, OCT  2. Diabetes mellitus, type 2 without retinopathy - The incidence, risk factors for progression, natural history and treatment options for diabetic  retinopathy  were discussed with patient.   - The need for close monitoring of blood glucose, blood pressure, and serum lipids, avoiding cigarette or any type of tobacco, and the need for long term follow up was also discussed with patient.  - f/u in 1 year, sooner prn  3. Mixed Cataract OU - The symptoms of cataract, surgical options, and treatments and risks were discussed with patient. - discussed diagnosis and progression -- specifically post vitrectomy progression - monitor  Ophthalmic Meds Ordered this visit:  No orders of the defined types were placed in this encounter.    Return for f/u 2-3 weeks, RD OS, DFE, OCT.  There are no Patient Instructions on file for this visit.  Explained the diagnoses, plan, and follow up with the patient and they expressed understanding.  Patient expressed understanding of the importance of proper follow up care.   This document serves as a record of services personally performed by Gardiner Sleeper, MD, PhD. It was created on their behalf by Estill Bamberg  Ether Griffins, OA an ophthalmic technician. The creation of this record is the provider's dictation and/or activities during the visit.    Electronically signed by: San Jetty. Owens Shark, [place] 05.26.2023 9:09 AM   Gardiner Sleeper, M.D., Ph.D. Diseases & Surgery of the Retina and Vitreous [facility]  I have reviewed the above documentation for accuracy and completeness, and I agree with the above. Gardiner Sleeper, M.D., Ph.D. 01/22/22 9:09 AM   Abbreviations: M myopia (nearsighted); A astigmatism; H hyperopia (farsighted); P presbyopia; Mrx spectacle prescription;  CTL contact lenses; OD right eye; OS left eye; OU both eyes  XT exotropia; ET esotropia; PEK punctate epithelial keratitis; PEE punctate epithelial erosions; DES dry eye syndrome; MGD meibomian gland dysfunction; ATs artificial tears; PFAT's preservative free artificial tears; [place] nuclear sclerotic cataract; PSC posterior  subcapsular cataract; ERM epi-retinal membrane; PVD posterior vitreous detachment; RD retinal detachment; DM diabetes mellitus; DR diabetic retinopathy; NPDR non-proliferative diabetic retinopathy; PDR proliferative diabetic retinopathy; CSME clinically significant macular edema; DME diabetic macular edema; dbh dot blot hemorrhages; CWS cotton wool spot; POAG primary open angle glaucoma; C/D cup-to-disc ratio; HVF humphrey visual field; GVF goldmann visual field; OCT optical coherence tomography; IOP intraocular pressure; BRVO Branch retinal vein occlusion; CRVO central retinal vein occlusion; CRAO central retinal artery occlusion; BRAO branch retinal artery occlusion; RT retinal tear; SB scleral buckle; PPV pars plana vitrectomy; VH Vitreous hemorrhage; PRP panretinal laser photocoagulation; IVK intravitreal kenalog; VMT vitreomacular traction; MH Macular hole;  NVD neovascularization of the disc; NVE neovascularization elsewhere; AREDS age related eye disease study; ARMD age related macular degeneration; POAG primary open angle glaucoma; EBMD epithelial/anterior basement membrane dystrophy; ACIOL anterior chamber intraocular lens; IOL intraocular lens; PCIOL posterior chamber intraocular lens; Phaco/IOL phacoemulsification with intraocular lens placement; [place] photorefractive keratectomy; LASIK laser assisted in situ keratomileusis; HTN hypertension; DM diabetes mellitus; COPD chronic obstructive pulmonary disease

## 2022-01-22 ENCOUNTER — Ambulatory Visit (INDEPENDENT_AMBULATORY_CARE_PROVIDER_SITE_OTHER): Payer: Medicare Other | Admitting: Ophthalmology

## 2022-01-22 ENCOUNTER — Encounter (INDEPENDENT_AMBULATORY_CARE_PROVIDER_SITE_OTHER): Payer: Self-pay | Admitting: Ophthalmology

## 2022-01-22 DIAGNOSIS — H3322 Serous retinal detachment, left eye: Secondary | ICD-10-CM

## 2022-01-22 DIAGNOSIS — H25813 Combined forms of age-related cataract, bilateral: Secondary | ICD-10-CM

## 2022-01-22 DIAGNOSIS — E119 Type 2 diabetes mellitus without complications: Secondary | ICD-10-CM

## 2022-01-26 NOTE — Progress Notes (Signed)
[facility] Note  02/05/2022     CHIEF COMPLAINT Patient presents for Retina Follow Up   HISTORY OF PRESENT ILLNESS: Melody Freeman is a 71 y.o. female who presents to the clinic today for:   HPI     Retina Follow Up   Patient presents with  Retinal Break/Detachment.  In left eye.  Severity is moderate.  Duration of 2 weeks.  Since onset it is gradually improving.  I, the attending physician,  performed the HPI with the patient and updated documentation appropriately.        Comments   Pt here for 2 wk f/u RD OS. Pt states OS VA is getting a bit better.       Last edited by Bernarda Caffey, MD on 02/05/2022  8:52 AM.       Referring physician: [place] Nation, MD [place],  [place] [address]  HISTORICAL INFORMATION:   Selected notes from the MEDICAL RECORD NUMBER Referred by Dr. Marin Comment for concern of RD OS LEE:  Ocular Hx- PMH-    CURRENT MEDICATIONS: Current Outpatient Medications (Ophthalmic Drugs)  Medication Sig   prednisoLONE acetate (PRED FORTE) 1 % ophthalmic suspension Place 1 drop into the left eye every 2 (two) hours while awake.   No current facility-administered medications for this visit. (Ophthalmic Drugs)   Current Outpatient Medications (Other)  Medication Sig   acetaminophen (TYLENOL) 500 MG tablet Take 1,000 mg by mouth every 6 (six) hours as needed.   HYDROcodone-acetaminophen (NORCO/VICODIN) 5-325 MG tablet Take 1 tablet by mouth every 4 (four) hours as needed for moderate pain.   ibuprofen (ADVIL) 200 MG tablet Take 600 mg by mouth every 6 (six) hours as needed for headache or moderate pain.   metFORMIN (GLUCOPHAGE-XR) 500 MG 24 hr tablet Take 1,000 mg by mouth 2 (two) times daily.   omeprazole (PRILOSEC) 20 MG capsule Take 20 mg by mouth daily as needed (acid reflux).   rosuvastatin (CRESTOR) 10 MG tablet Take 10 mg by mouth daily.   sertraline (ZOLOFT) 50 MG tablet Take 50 mg by mouth daily.   No  current facility-administered medications for this visit. (Other)   REVIEW OF SYSTEMS: ROS   Positive for: Eyes Negative for: Constitutional, Gastrointestinal, Neurological, Skin, Genitourinary, Musculoskeletal, HENT, Endocrine, Cardiovascular, Respiratory, Psychiatric, Allergic/Imm, Heme/Lymph Last edited by Kingsley Spittle, COT on 02/05/2022  7:47 AM.     ALLERGIES Allergies  Allergen Reactions   Codeine Nausea And Vomiting   PAST MEDICAL HISTORY Past Medical History:  Diagnosis Date   Arthritis    Depression    Diabetes mellitus without complication ([place])    GERD (gastroesophageal reflux disease)    PONV (postoperative nausea and vomiting)    Past Surgical History:  Procedure Laterality Date   ABDOMINAL HYSTERECTOMY     AIR/FLUID EXCHANGE Left 01/08/2022   Procedure: AIR/FLUID EXCHANGE;  Surgeon: Bernarda Caffey, MD;  Location: [facility];  Service: Ophthalmology;  Laterality: Left;   CHOLECYSTECTOMY     GAS INSERTION Left 01/08/2022   Procedure: INSERTION OF GAS C3F8;  Surgeon: Bernarda Caffey, MD;  Location: [facility];  Service: Ophthalmology;  Laterality: Left;   PERFLUORONE INJECTION Left 01/08/2022   Procedure: PERFLUORON INJECTION;  Surgeon: Bernarda Caffey, MD;  Location: [facility];  Service: Ophthalmology;  Laterality: Left;   PHOTOCOAGULATION WITH LASER Left 01/08/2022   Procedure: PHOTOCOAGULATION WITH LASER;  Surgeon: Bernarda Caffey, MD;  Location: [facility];  Service: Ophthalmology;  Laterality:  Left;   VITRECTOMY 25 GAUGE WITH SCLERAL BUCKLE Left 01/08/2022   Procedure: TWENTY-FIVE GAUGE PARS PLANA VITRECTOMY WITH SCLERAL BUCKLE;  Surgeon: Bernarda Caffey, MD;  Location: [facility];  Service: Ophthalmology;  Laterality: Left;   FAMILY HISTORY History reviewed. No pertinent family history.  SOCIAL HISTORY Social History   Tobacco Use   Smoking status: Never   Smokeless tobacco: Never  Vaping Use   Vaping Use: Never used  Substance Use Topics   Alcohol use: Never   Drug use: Never        OPHTHALMIC EXAM:  Base Eye Exam     Visual Acuity (Snellen - Linear)       Right Left   Dist cc 20/20 -1 20/300 -1    Correction: Glasses         Tonometry (Tonopen, 7:54 AM)       Right Left   Pressure 13 8         Pupils       Dark Light Shape React APD   Right 4 3 Round Brisk None   Left Dilated             Visual Fields (Counting fingers)       Left Right     Full   Restrictions Partial outer inferior temporal, inferior nasal deficiencies          Extraocular Movement       Right Left    Full, Ortho Full, Ortho         Neuro/Psych     Oriented x3: Yes   Mood/Affect: Normal         Dilation     Both eyes: 1.0% Mydriacyl, 2.5% Phenylephrine @ 7:55 AM           Slit Lamp and Fundus Exam     Slit Lamp Exam       Right Left   Lids/Lashes Dermatochalasis - upper lid, mild MGD Dermatochalasis - upper lid, mild MGD, lid edema -- improved   Conjunctiva/Sclera White and quiet Subconjunctival hemorrhage - resolved, sutures intact   Cornea mild arcus, trace PEE trace Punctate epithelial erosions, mild arcus   Anterior Chamber Deep and quiet Deep, narrow angles, 0.5+cell   Iris Round and dilated, No NVI irregular pupil, No NVI, Posterior synechiae 0730   Lens 2+ Nuclear sclerosis, 2+ Cortical cataract 2+ Nuclear sclerosis, 2+ Cortical cataract, 3+ PSC   Anterior Vitreous Vitreous syneresis post vitrectomy, gas bubble 55-60%         Fundus Exam       Right Left   Disc  Pink and Sharp   C/D Ratio 0.3 0.3   Macula  flat under gas, inferior retina visibly reattached   Vessels  mild attenuation   Periphery  Attached over buckle, good buckle height, good laser over buckle and around tears, PRE-OP: Bullous ST detachment from 1100-0400 with small holes at 1200           IMAGING AND PROCEDURES  Imaging and Procedures for 02/05/2022  OCT, Retina - OU - Both Eyes       Right Eye Quality was good. Central Foveal Thickness: 281.  Progression has been stable. Findings include normal foveal contour, no IRF, no SRF, vitreomacular adhesion .   Left Eye Quality was poor. Progression has improved. Findings include no IRF, no SRF, abnormal foveal contour (Majority of macula obscured by gas bubble, but inferior macula reattached nicely ).   Notes *Images captured and stored on drive  Diagnosis / Impression:  OD: NFP, no IRF/SRF OS: Majority of macula obscured by gas bubble, but inferior macula reattached nicely   Clinical management:  See below  Abbreviations: NFP - Normal foveal profile. CME - cystoid macular edema. PED - pigment epithelial detachment. IRF - intraretinal fluid. SRF - subretinal fluid. EZ - ellipsoid zone. ERM - epiretinal membrane. ORA - outer retinal atrophy. ORT - outer retinal tubulation. SRHM - subretinal hyper-reflective material. IRHM - intraretinal hyper-reflective material            ASSESSMENT/PLAN:    ICD-10-CM   1. Left retinal detachment  H33.22 OCT, Retina - OU - Both Eyes    2. Diabetes mellitus type 2 without retinopathy ([place])  E11.9 OCT, Retina - OU - Both Eyes    3. 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, onset of foveal involvement about 1-2 week ago by pt history - detached from 11 to 4 oclock, fovea off, microtears ~130 oclock within areas of very thin retina - POW4 s/p PPV/POC/EL/FAX/14% C3F8 OS, 05.18.2023             - doing well             - retina attached and in good position -- good buckle height and laser around breaks  - fibrin rxn stably resolved -- pt had switched PF and atropine drop instructions -- focal PS at 0730             - IOP okay at 8             - cont   PF QID OS                         PSO ung QID OS -- PRN             - face down positioning when able; avoid laying flat on back, may sleep on either side   - continue limited head movement especially  during activities   - continue no driving              - post op drop and positioning instructions reviewed              - tylenol/ibuprofen for pain              - Rx given for breakthrough pain  - f/u 4 weeks, DFE, OCT  2. Diabetes mellitus, type 2 without retinopathy - The incidence, risk factors for progression, natural history and treatment options for diabetic retinopathy  were discussed with patient.   - The need for close monitoring of blood glucose, blood pressure, and serum lipids, avoiding cigarette or any type of tobacco, and the need for long term follow up was also discussed with patient.  - f/u in 1 year, sooner prn  3. Mixed Cataract OU - The symptoms of cataract, surgical options, and treatments and risks were discussed with patient. - discussed diagnosis and progression -- specifically post vitrectomy progression - monitor  Ophthalmic Meds Ordered this visit:  No orders of the defined types were placed in this encounter.    Return in about 4 weeks (around 03/05/2022) for DFE, OCT.  There are no Patient Instructions on file for this visit.  Explained the diagnoses, plan, and follow up with the patient and they expressed understanding.  Patient expressed understanding of  the importance of proper follow up care.   This document serves as a record of services personally performed by Gardiner Sleeper, MD, PhD. It was created on their behalf by San Jetty. Owens Shark, OA an ophthalmic technician. The creation of this record is the provider's dictation and/or activities during the visit.    Electronically signed by: San Jetty. Owens Shark, [place] 06.05.2023 8:54 AM  This document serves as a record of services personally performed by Gardiner Sleeper, MD, PhD. It was created on their behalf by Leonie Douglas, an ophthalmic technician. The creation of this record is the provider's dictation and/or activities during the visit.    Electronically signed by: Leonie Douglas COA, 02/05/22  8:54  AM   Gardiner Sleeper, M.D., Ph.D. Diseases & Surgery of the Retina and Vitreous [facility]  I have reviewed the above documentation for accuracy and completeness, and I agree with the above. Gardiner Sleeper, M.D., Ph.D. 02/05/22 8:55 AM   Abbreviations: M myopia (nearsighted); A astigmatism; H hyperopia (farsighted); P presbyopia; Mrx spectacle prescription;  CTL contact lenses; OD right eye; OS left eye; OU both eyes  XT exotropia; ET esotropia; PEK punctate epithelial keratitis; PEE punctate epithelial erosions; DES dry eye syndrome; MGD meibomian gland dysfunction; ATs artificial tears; PFAT's preservative free artificial tears; [place] nuclear sclerotic cataract; PSC posterior subcapsular cataract; ERM epi-retinal membrane; PVD posterior vitreous detachment; RD retinal detachment; DM diabetes mellitus; DR diabetic retinopathy; NPDR non-proliferative diabetic retinopathy; PDR proliferative diabetic retinopathy; CSME clinically significant macular edema; DME diabetic macular edema; dbh dot blot hemorrhages; CWS cotton wool spot; POAG primary open angle glaucoma; C/D cup-to-disc ratio; HVF humphrey visual field; GVF goldmann visual field; OCT optical coherence tomography; IOP intraocular pressure; BRVO Branch retinal vein occlusion; CRVO central retinal vein occlusion; CRAO central retinal artery occlusion; BRAO branch retinal artery occlusion; RT retinal tear; SB scleral buckle; PPV pars plana vitrectomy; VH Vitreous hemorrhage; PRP panretinal laser photocoagulation; IVK intravitreal kenalog; VMT vitreomacular traction; MH Macular hole;  NVD neovascularization of the disc; NVE neovascularization elsewhere; AREDS age related eye disease study; ARMD age related macular degeneration; POAG primary open angle glaucoma; EBMD epithelial/anterior basement membrane dystrophy; ACIOL anterior chamber intraocular lens; IOL intraocular lens; PCIOL posterior chamber intraocular lens; Phaco/IOL  phacoemulsification with intraocular lens placement; [place] photorefractive keratectomy; LASIK laser assisted in situ keratomileusis; HTN hypertension; DM diabetes mellitus; COPD chronic obstructive pulmonary disease

## 2022-02-05 ENCOUNTER — Encounter (INDEPENDENT_AMBULATORY_CARE_PROVIDER_SITE_OTHER): Payer: Self-pay | Admitting: Ophthalmology

## 2022-02-05 ENCOUNTER — Ambulatory Visit (INDEPENDENT_AMBULATORY_CARE_PROVIDER_SITE_OTHER): Payer: Medicare Other | Admitting: Ophthalmology

## 2022-02-05 DIAGNOSIS — H3322 Serous retinal detachment, left eye: Secondary | ICD-10-CM

## 2022-02-05 DIAGNOSIS — E119 Type 2 diabetes mellitus without complications: Secondary | ICD-10-CM

## 2022-02-05 DIAGNOSIS — H25813 Combined forms of age-related cataract, bilateral: Secondary | ICD-10-CM

## 2022-02-18 ENCOUNTER — Other Ambulatory Visit (INDEPENDENT_AMBULATORY_CARE_PROVIDER_SITE_OTHER): Payer: Self-pay

## 2022-02-18 DIAGNOSIS — E119 Type 2 diabetes mellitus without complications: Secondary | ICD-10-CM

## 2022-02-18 DIAGNOSIS — H3322 Serous retinal detachment, left eye: Secondary | ICD-10-CM

## 2022-02-18 DIAGNOSIS — H25813 Combined forms of age-related cataract, bilateral: Secondary | ICD-10-CM

## 2022-02-18 NOTE — Progress Notes (Signed)
[facility] Note  02/18/2022     CHIEF COMPLAINT Patient presents for No chief complaint on file.   HISTORY OF PRESENT ILLNESS: Melody Freeman is a 71 y.o. female who presents to the clinic today for:       Referring physician: No referring provider defined for this encounter.  HISTORICAL INFORMATION:   Selected notes from the MEDICAL RECORD NUMBER Referred by Dr. Marin Comment for concern of RD OS LEE:  Ocular Hx- PMH-    CURRENT MEDICATIONS: Current Outpatient Medications (Ophthalmic Drugs)  Medication Sig   prednisoLONE acetate (PRED FORTE) 1 % ophthalmic suspension Place 1 drop into the left eye every 2 (two) hours while awake.   No current facility-administered medications for this visit. (Ophthalmic Drugs)   Current Outpatient Medications (Other)  Medication Sig   acetaminophen (TYLENOL) 500 MG tablet Take 1,000 mg by mouth every 6 (six) hours as needed.   HYDROcodone-acetaminophen (NORCO/VICODIN) 5-325 MG tablet Take 1 tablet by mouth every 4 (four) hours as needed for moderate pain.   ibuprofen (ADVIL) 200 MG tablet Take 600 mg by mouth every 6 (six) hours as needed for headache or moderate pain.   metFORMIN (GLUCOPHAGE-XR) 500 MG 24 hr tablet Take 1,000 mg by mouth 2 (two) times daily.   omeprazole (PRILOSEC) 20 MG capsule Take 20 mg by mouth daily as needed (acid reflux).   rosuvastatin (CRESTOR) 10 MG tablet Take 10 mg by mouth daily.   sertraline (ZOLOFT) 50 MG tablet Take 50 mg by mouth daily.   No current facility-administered medications for this visit. (Other)   REVIEW OF SYSTEMS:   ALLERGIES Allergies  Allergen Reactions   Codeine Nausea And Vomiting   PAST MEDICAL HISTORY Past Medical History:  Diagnosis Date   Arthritis    Depression    Diabetes mellitus without complication (HCC)    GERD (gastroesophageal reflux disease)    PONV (postoperative nausea and vomiting)    Past Surgical History:  Procedure Laterality  Date   ABDOMINAL HYSTERECTOMY     AIR/FLUID EXCHANGE Left 01/08/2022   Procedure: AIR/FLUID EXCHANGE;  Surgeon: Bernarda Caffey, MD;  Location: [place];  Service: Ophthalmology;  Laterality: Left;   CHOLECYSTECTOMY     GAS INSERTION Left 01/08/2022   Procedure: INSERTION OF GAS C3F8;  Surgeon: Bernarda Caffey, MD;  Location: [place];  Service: Ophthalmology;  Laterality: Left;   PERFLUORONE INJECTION Left 01/08/2022   Procedure: PERFLUORON INJECTION;  Surgeon: Bernarda Caffey, MD;  Location: [place];  Service: Ophthalmology;  Laterality: Left;   PHOTOCOAGULATION WITH LASER Left 01/08/2022   Procedure: PHOTOCOAGULATION WITH LASER;  Surgeon: Bernarda Caffey, MD;  Location: [place];  Service: Ophthalmology;  Laterality: Left;   VITRECTOMY 25 GAUGE WITH SCLERAL BUCKLE Left 01/08/2022   Procedure: TWENTY-FIVE GAUGE PARS PLANA VITRECTOMY WITH SCLERAL BUCKLE;  Surgeon: Bernarda Caffey, MD;  Location: [place];  Service: Ophthalmology;  Laterality: Left;   FAMILY HISTORY No family history on file.  SOCIAL HISTORY Social History   Tobacco Use   Smoking status: Never   Smokeless tobacco: Never  Vaping Use   Vaping Use: Never used  Substance Use Topics   Alcohol use: Never   Drug use: Never       OPHTHALMIC EXAM:  Not recorded    IMAGING AND PROCEDURES  Imaging and Procedures for 02/18/2022          ASSESSMENT/PLAN:    ICD-10-CM   1. Left retinal detachment  H33.22  2. Diabetes mellitus type 2 without retinopathy ([place])  E11.9     3. 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, onset of foveal involvement about 1-2 week ago by pt history - detached from 11 to 4 oclock, fovea off, microtears ~130 oclock within areas of very thin retina - POW4 s/p PPV/POC/EL/FAX/14% C3F8 OS, 05.18.2023             - doing well             - retina attached and in good position -- good buckle height and laser around  breaks  - fibrin rxn stably resolved -- pt had switched PF and atropine drop instructions -- focal PS at 0730             - IOP okay at 8             - cont   PF QID OS                         PSO ung QID OS -- PRN             - face down positioning when able; avoid laying flat on back, may sleep on either side   - continue limited head movement especially during activities   - continue no driving              - post op drop and positioning instructions reviewed              - tylenol/ibuprofen for pain              - Rx given for breakthrough pain  - f/u 4 weeks, DFE, OCT  2. Diabetes mellitus, type 2 without retinopathy - The incidence, risk factors for progression, natural history and treatment options for diabetic retinopathy  were discussed with patient.   - The need for close monitoring of blood glucose, blood pressure, and serum lipids, avoiding cigarette or any type of tobacco, and the need for long term follow up was also discussed with patient.  - f/u in 1 year, sooner prn  3. Mixed Cataract OU - The symptoms of cataract, surgical options, and treatments and risks were discussed with patient. - discussed diagnosis and progression -- specifically post vitrectomy progression - monitor  Ophthalmic Meds Ordered this visit:  No orders of the defined types were placed in this encounter.    No follow-ups on file.  There are no Patient Instructions on file for this visit.  Explained the diagnoses, plan, and follow up with the patient and they expressed understanding.  Patient expressed understanding of the importance of proper follow up care.   This document serves as a record of services personally performed by Gardiner Sleeper, MD, PhD. It was created on their behalf by San Jetty. Owens Shark, OA an ophthalmic technician. The creation of this record is the provider's dictation and/or activities during the visit.    Electronically signed by: San Jetty. Marguerita Merles 06.28.2023 12:48  PM    Gardiner Sleeper, M.D., Ph.D. Diseases & Surgery of the Retina and Vitreous [facility]: M myopia (nearsighted); A astigmatism; H hyperopia (farsighted); P presbyopia; Mrx spectacle prescription;  CTL contact lenses; OD right eye; OS left eye; OU both eyes  XT exotropia; ET esotropia; PEK punctate  epithelial keratitis; PEE punctate epithelial erosions; DES dry eye syndrome; MGD meibomian gland dysfunction; ATs artificial tears; PFAT's preservative free artificial tears; [place] nuclear sclerotic cataract; PSC posterior subcapsular cataract; ERM epi-retinal membrane; PVD posterior vitreous detachment; RD retinal detachment; DM diabetes mellitus; DR diabetic retinopathy; NPDR non-proliferative diabetic retinopathy; PDR proliferative diabetic retinopathy; CSME clinically significant macular edema; DME diabetic macular edema; dbh dot blot hemorrhages; CWS cotton wool spot; POAG primary open angle glaucoma; C/D cup-to-disc ratio; HVF humphrey visual field; GVF goldmann visual field; OCT optical coherence tomography; IOP intraocular pressure; BRVO Branch retinal vein occlusion; CRVO central retinal vein occlusion; CRAO central retinal artery occlusion; BRAO branch retinal artery occlusion; RT retinal tear; SB scleral buckle; PPV pars plana vitrectomy; VH Vitreous hemorrhage; PRP panretinal laser photocoagulation; IVK intravitreal kenalog; VMT vitreomacular traction; MH Macular hole;  NVD neovascularization of the disc; NVE neovascularization elsewhere; AREDS age related eye disease study; ARMD age related macular degeneration; POAG primary open angle glaucoma; EBMD epithelial/anterior basement membrane dystrophy; ACIOL anterior chamber intraocular lens; IOL intraocular lens; PCIOL posterior chamber intraocular lens; Phaco/IOL phacoemulsification with intraocular lens placement; [place] photorefractive keratectomy; LASIK laser assisted in situ keratomileusis; HTN  hypertension; DM diabetes mellitus; COPD chronic obstructive pulmonary disease

## 2022-02-25 NOTE — Progress Notes (Signed)
[facility] Note  03/05/2022     CHIEF COMPLAINT Patient presents for Post-op Follow-up   HISTORY OF PRESENT ILLNESS: Melody Freeman is a 71 y.o. female who presents to the clinic today for:   HPI     Post-op Follow-up   In left eye.  Discomfort includes floaters.  I, the attending physician,  performed the HPI with the patient and updated documentation appropriately.        Comments   8 week post op RD repair OS-  Yesterday she started seeing floaters in vision OS.  Denies FOLs.  She is still seeing the top part of the bubble when she looks down.  Prednisolone QID OS.       Last edited by Bernarda Caffey, MD on 03/07/2022  2:43 AM.    Pt states she lost her gas bracelet, she can still see the gas bubble when she looks down  Referring physician: [place], MD [place],  [place] [id number]  HISTORICAL INFORMATION:   Selected notes from the MEDICAL RECORD NUMBER Referred by Dr. Marin Comment for concern of RD OS LEE:  Ocular Hx- PMH-    CURRENT MEDICATIONS: Current Outpatient Medications (Ophthalmic Drugs)  Medication Sig   prednisoLONE acetate (PRED FORTE) 1 % ophthalmic suspension Place 1 drop into the left eye 3 (three) times daily.   No current facility-administered medications for this visit. (Ophthalmic Drugs)   Current Outpatient Medications (Other)  Medication Sig   acetaminophen (TYLENOL) 500 MG tablet Take 1,000 mg by mouth every 6 (six) hours as needed.   ibuprofen (ADVIL) 200 MG tablet Take 600 mg by mouth every 6 (six) hours as needed for headache or moderate pain.   metFORMIN (GLUCOPHAGE-XR) 500 MG 24 hr tablet Take 1,000 mg by mouth 2 (two) times daily.   omeprazole (PRILOSEC) 20 MG capsule Take 20 mg by mouth daily as needed (acid reflux).   rosuvastatin (CRESTOR) 10 MG tablet Take 10 mg by mouth daily.   sertraline (ZOLOFT) 50 MG tablet Take 50 mg by mouth daily.   HYDROcodone-acetaminophen (NORCO/VICODIN) 5-325  MG tablet Take 1 tablet by mouth every 4 (four) hours as needed for moderate pain. (Patient not taking: Reported on 03/05/2022)   No current facility-administered medications for this visit. (Other)   REVIEW OF SYSTEMS: ROS   Positive for: Gastrointestinal, Endocrine, Eyes, Psychiatric Negative for: Constitutional, Neurological, Skin, Genitourinary, Musculoskeletal, HENT, Cardiovascular, Respiratory, Allergic/Imm, Heme/Lymph Last edited by Leonie Douglas, COA on 03/05/2022  8:08 AM.     ALLERGIES Allergies  Allergen Reactions   Codeine Nausea And Vomiting   PAST MEDICAL HISTORY Past Medical History:  Diagnosis Date   Arthritis    Depression    Diabetes mellitus without complication ([place])    GERD (gastroesophageal reflux disease)    PONV (postoperative nausea and vomiting)    Past Surgical History:  Procedure Laterality Date   ABDOMINAL HYSTERECTOMY     AIR/FLUID EXCHANGE Left 01/08/2022   Procedure: AIR/FLUID EXCHANGE;  Surgeon: Bernarda Caffey, MD;  Location: [place];  Service: Ophthalmology;  Laterality: Left;   CHOLECYSTECTOMY     GAS INSERTION Left 01/08/2022   Procedure: INSERTION OF GAS C3F8;  Surgeon: Bernarda Caffey, MD;  Location: [place];  Service: Ophthalmology;  Laterality: Left;   PERFLUORONE INJECTION Left 01/08/2022   Procedure: PERFLUORON INJECTION;  Surgeon: Bernarda Caffey, MD;  Location: [place];  Service: Ophthalmology;  Laterality: Left;   PHOTOCOAGULATION WITH LASER Left 01/08/2022  Procedure: PHOTOCOAGULATION WITH LASER;  Surgeon: Bernarda Caffey, MD;  Location: [place];  Service: Ophthalmology;  Laterality: Left;   VITRECTOMY 25 GAUGE WITH SCLERAL BUCKLE Left 01/08/2022   Procedure: TWENTY-FIVE GAUGE PARS PLANA VITRECTOMY WITH SCLERAL BUCKLE;  Surgeon: Bernarda Caffey, MD;  Location: [place];  Service: Ophthalmology;  Laterality: Left;   FAMILY HISTORY History reviewed. No pertinent family history.  SOCIAL HISTORY Social History   Tobacco Use   Smoking status: Never    Smokeless tobacco: Never  Vaping Use   Vaping Use: Never used  Substance Use Topics   Alcohol use: Never   Drug use: Never       OPHTHALMIC EXAM:  Base Eye Exam     Visual Acuity (Snellen - Linear)       Right Left   Dist cc 20/20- CF 4'   Dist ph cc  20/150 +2    Correction: Glasses         Tonometry (Tonopen, 8:14 AM)       Right Left   Pressure 14 12         Pupils       Dark Light Shape React APD   Right 4 3 Round Brisk None   Left 7 7 Round NR          Neuro/Psych     Oriented x3: Yes   Mood/Affect: Normal         Dilation     Both eyes: 1.0% Mydriacyl, 2.5% Phenylephrine @ 8:14 AM           Slit Lamp and Fundus Exam     Slit Lamp Exam       Right Left   Lids/Lashes Dermatochalasis - upper lid, mild MGD Dermatochalasis - upper lid, mild MGD, lid edema -- improved   Conjunctiva/Sclera White and quiet sutures intact   Cornea mild arcus, trace PEE trace fine Punctate epithelial erosions, mild arcus, mild EBMD   Anterior Chamber Deep and quiet Deep, narrow angles, 0.5+cell   Iris Round and dilated, No NVI irregular pupil, No NVI, Posterior synechiae 0730 and 0830   Lens 2+ Nuclear sclerosis, 2+ Cortical cataract 2+ Nuclear sclerosis, 2+ Cortical cataract, 2-3+ PSC   Anterior Vitreous Vitreous syneresis post vitrectomy, gas bubble 25%         Fundus Exam       Right Left   Disc Pink and Sharp Pink and Sharp   C/D Ratio 0.3 0.3   Macula Flat, Blunted foveal reflex, RPE mottling, mild drusen, mild ERM, No heme or edema Flat, re-attached, Good foveal reflex, trace ERM   Vessels mild attenuation attenuated   Periphery Attached, No RT/RD, No heme Attached over buckle, good buckle height, good laser over buckle and around tears, PRE-OP: Bullous ST detachment from 1100-0400 with small holes at 1200           Refraction     Wearing Rx       Sphere Cylinder Axis Add   Right +1.25 +1.00 015 +2.25   Left +1.50 Sphere  +2.25            IMAGING AND PROCEDURES  Imaging and Procedures for 03/05/2022  OCT, Retina - OU - Both Eyes       Right Eye Quality was good. Central Foveal Thickness: 254. Progression has been stable. Findings include normal foveal contour, no IRF, no SRF, vitreomacular adhesion .   Left Eye Quality was good. Central Foveal Thickness: 235. Progression has improved. Findings include  normal foveal contour, no IRF, subretinal fluid (Retina re-attached; Scattered focal pockets of SRF SN macula and superior periphery seen best on widefield).   Notes *Images captured and stored on drive  Diagnosis / Impression:  OD: NFP, no IRF/SRF OS: Retina re-attached; Scattered focal pockets of SRF SN macula and superior periphery seen best on widefield  Clinical management:  See below  Abbreviations: NFP - Normal foveal profile. CME - cystoid macular edema. PED - pigment epithelial detachment. IRF - intraretinal fluid. SRF - subretinal fluid. EZ - ellipsoid zone. ERM - epiretinal membrane. ORA - outer retinal atrophy. ORT - outer retinal tubulation. SRHM - subretinal hyper-reflective material. IRHM - intraretinal hyper-reflective material            ASSESSMENT/PLAN:    ICD-10-CM   1. Left retinal detachment  H33.22 OCT, Retina - OU - Both Eyes    2. Diabetes mellitus type 2 without retinopathy ([place])  E11.9     3. 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, onset of foveal involvement about 1-2 week ago by pt history - detached from 11 to 4 oclock, fovea off, microtears ~130 oclock within areas of very thin retina - POW8 s/p PPV/PFO/EL/FAX/14% C3F8 OS, 05.18.2023             - doing well             - retina attached and in good position -- good buckle height and laser around breaks  - fibrin rxn stably resolved -- pt had switched PF and atropine drop instructions -- focal PS at 0730             - IOP  okay at 12  - gas bubble ~25%             - cont   PF QID OS -- decrease to TID                         PSO ung QID OS -- PRN             - avoid laying flat on back, may sleep on either side   - continue limited head movement especially during activities   - continue no driving              - post op drop and positioning instructions reviewed              - tylenol/ibuprofen for pain              - Rx given for breakthrough pain  - f/u 4 weeks, DFE, OCT  2. Diabetes mellitus, type 2 without retinopathy - The incidence, risk factors for progression, natural history and treatment options for diabetic retinopathy  were discussed with patient.   - The need for close monitoring of blood glucose, blood pressure, and serum lipids, avoiding cigarette or any type of tobacco, and the need for long term follow up was also discussed with patient.  - f/u in 1 year, sooner prn  3. Mixed Cataract OU - The symptoms of cataract, surgical options, and treatments and risks were discussed with patient. - discussed diagnosis and progression -- specifically post vitrectomy progression - monitor  Ophthalmic Meds Ordered this visit:  Meds ordered this encounter  Medications   prednisoLONE acetate (PRED FORTE) 1 % ophthalmic suspension    Sig: Place  1 drop into the left eye 3 (three) times daily.    Dispense:  15 mL    Refill:  0     Return in about 4 weeks (around 04/02/2022) for f/u RD OS, DFE, OCT.  There are no Patient Instructions on file for this visit.  Explained the diagnoses, plan, and follow up with the patient and they expressed understanding.  Patient expressed understanding of the importance of proper follow up care.   This document serves as a record of services personally performed by Gardiner Sleeper, MD, PhD. It was created on their behalf by San Jetty. Owens Shark, OA an ophthalmic technician. The creation of this record is the provider's dictation and/or activities during the visit.     Electronically signed by: San Jetty. Owens Shark, [place] 06.28.2023 2:45 AM  Gardiner Sleeper, M.D., Ph.D. Diseases & Surgery of the Retina and Vitreous [facility]  I have reviewed the above documentation for accuracy and completeness, and I agree with the above. Gardiner Sleeper, M.D., Ph.D. 03/07/22 2:46 AM  Abbreviations: M myopia (nearsighted); A astigmatism; H hyperopia (farsighted); P presbyopia; Mrx spectacle prescription;  CTL contact lenses; OD right eye; OS left eye; OU both eyes  XT exotropia; ET esotropia; PEK punctate epithelial keratitis; PEE punctate epithelial erosions; DES dry eye syndrome; MGD meibomian gland dysfunction; ATs artificial tears; PFAT's preservative free artificial tears; [place] nuclear sclerotic cataract; PSC posterior subcapsular cataract; ERM epi-retinal membrane; PVD posterior vitreous detachment; RD retinal detachment; DM diabetes mellitus; DR diabetic retinopathy; NPDR non-proliferative diabetic retinopathy; PDR proliferative diabetic retinopathy; CSME clinically significant macular edema; DME diabetic macular edema; dbh dot blot hemorrhages; CWS cotton wool spot; POAG primary open angle glaucoma; C/D cup-to-disc ratio; HVF humphrey visual field; GVF goldmann visual field; OCT optical coherence tomography; IOP intraocular pressure; BRVO Branch retinal vein occlusion; CRVO central retinal vein occlusion; CRAO central retinal artery occlusion; BRAO branch retinal artery occlusion; RT retinal tear; SB scleral buckle; PPV pars plana vitrectomy; VH Vitreous hemorrhage; PRP panretinal laser photocoagulation; IVK intravitreal kenalog; VMT vitreomacular traction; MH Macular hole;  NVD neovascularization of the disc; NVE neovascularization elsewhere; AREDS age related eye disease study; ARMD age related macular degeneration; POAG primary open angle glaucoma; EBMD epithelial/anterior basement membrane dystrophy; ACIOL anterior chamber intraocular lens; IOL intraocular  lens; PCIOL posterior chamber intraocular lens; Phaco/IOL phacoemulsification with intraocular lens placement; [place] photorefractive keratectomy; LASIK laser assisted in situ keratomileusis; HTN hypertension; DM diabetes mellitus; COPD chronic obstructive pulmonary disease

## 2022-03-02 DIAGNOSIS — E559 Vitamin D deficiency, unspecified: Secondary | ICD-10-CM | POA: Diagnosis not present

## 2022-03-02 DIAGNOSIS — Z0001 Encounter for general adult medical examination with abnormal findings: Secondary | ICD-10-CM | POA: Diagnosis not present

## 2022-03-02 DIAGNOSIS — Z1329 Encounter for screening for other suspected endocrine disorder: Secondary | ICD-10-CM | POA: Diagnosis not present

## 2022-03-02 DIAGNOSIS — Z131 Encounter for screening for diabetes mellitus: Secondary | ICD-10-CM | POA: Diagnosis not present

## 2022-03-02 DIAGNOSIS — R739 Hyperglycemia, unspecified: Secondary | ICD-10-CM | POA: Diagnosis not present

## 2022-03-02 DIAGNOSIS — D72829 Elevated white blood cell count, unspecified: Secondary | ICD-10-CM | POA: Diagnosis not present

## 2022-03-02 DIAGNOSIS — E782 Mixed hyperlipidemia: Secondary | ICD-10-CM | POA: Diagnosis not present

## 2022-03-05 ENCOUNTER — Encounter (INDEPENDENT_AMBULATORY_CARE_PROVIDER_SITE_OTHER): Payer: Self-pay | Admitting: Ophthalmology

## 2022-03-05 ENCOUNTER — Ambulatory Visit (INDEPENDENT_AMBULATORY_CARE_PROVIDER_SITE_OTHER): Payer: Medicare Other | Admitting: Ophthalmology

## 2022-03-05 DIAGNOSIS — H25813 Combined forms of age-related cataract, bilateral: Secondary | ICD-10-CM

## 2022-03-05 DIAGNOSIS — E119 Type 2 diabetes mellitus without complications: Secondary | ICD-10-CM

## 2022-03-05 DIAGNOSIS — H3322 Serous retinal detachment, left eye: Secondary | ICD-10-CM

## 2022-03-05 MED ORDER — PREDNISOLONE ACETATE 1 % OP SUSP: 1.0000 [drp] | Freq: Three times a day (TID) | OPHTHALMIC | 0 refills | Status: AC

## 2022-03-07 ENCOUNTER — Encounter (INDEPENDENT_AMBULATORY_CARE_PROVIDER_SITE_OTHER): Payer: Self-pay | Admitting: Ophthalmology

## 2022-03-12 DIAGNOSIS — R030 Elevated blood-pressure reading, without diagnosis of hypertension: Secondary | ICD-10-CM | POA: Diagnosis not present

## 2022-03-12 DIAGNOSIS — M542 Cervicalgia: Secondary | ICD-10-CM | POA: Diagnosis not present

## 2022-03-12 DIAGNOSIS — R1310 Dysphagia, unspecified: Secondary | ICD-10-CM | POA: Diagnosis not present

## 2022-03-12 DIAGNOSIS — M25519 Pain in unspecified shoulder: Secondary | ICD-10-CM | POA: Diagnosis not present

## 2022-03-12 DIAGNOSIS — R519 Headache, unspecified: Secondary | ICD-10-CM | POA: Diagnosis not present

## 2022-03-12 DIAGNOSIS — H8110 Benign paroxysmal vertigo, unspecified ear: Secondary | ICD-10-CM | POA: Diagnosis not present

## 2022-03-12 DIAGNOSIS — Q159 Congenital malformation of eye, unspecified: Secondary | ICD-10-CM | POA: Diagnosis not present

## 2022-03-12 DIAGNOSIS — Z86010 Personal history of colonic polyps: Secondary | ICD-10-CM | POA: Diagnosis not present

## 2022-03-12 DIAGNOSIS — Z0001 Encounter for general adult medical examination with abnormal findings: Secondary | ICD-10-CM | POA: Diagnosis not present

## 2022-03-13 DIAGNOSIS — Z0001 Encounter for general adult medical examination with abnormal findings: Secondary | ICD-10-CM | POA: Diagnosis not present

## 2022-03-24 NOTE — Progress Notes (Signed)
[facility] Note  04/02/2022     CHIEF COMPLAINT Patient presents for Post-op Follow-up   HISTORY OF PRESENT ILLNESS: Melody Freeman is a 71 y.o. female who presents to the clinic today for:   HPI     Post-op Follow-up   In left eye.  Discomfort includes floaters.  I, the attending physician,  performed the HPI with the patient and updated documentation appropriately.        Comments   Patient states that the vision has improved and she can see. She states that she can still the gas bubble in the left eye.       Last edited by Bernarda Caffey, MD on 04/03/2022  1:33 AM.     Pt states she can still see the gas bubble in her vision, it is continuing to shrink  Referring physician: Sulligent Nation, MD [place],  [place] [id number]  HISTORICAL INFORMATION:   Selected notes from the MEDICAL RECORD NUMBER Referred by Dr. Marin Comment for concern of RD OS LEE:  Ocular Hx- PMH-    CURRENT MEDICATIONS: Current Outpatient Medications (Ophthalmic Drugs)  Medication Sig   prednisoLONE acetate (PRED FORTE) 1 % ophthalmic suspension Place 1 drop into the left eye 3 (three) times daily.   No current facility-administered medications for this visit. (Ophthalmic Drugs)   Current Outpatient Medications (Other)  Medication Sig   acetaminophen (TYLENOL) 500 MG tablet Take 1,000 mg by mouth every 6 (six) hours as needed.   HYDROcodone-acetaminophen (NORCO/VICODIN) 5-325 MG tablet Take 1 tablet by mouth every 4 (four) hours as needed for moderate pain. (Patient not taking: Reported on 03/05/2022)   ibuprofen (ADVIL) 200 MG tablet Take 600 mg by mouth every 6 (six) hours as needed for headache or moderate pain.   metFORMIN (GLUCOPHAGE-XR) 500 MG 24 hr tablet Take 1,000 mg by mouth 2 (two) times daily.   omeprazole (PRILOSEC) 20 MG capsule Take 20 mg by mouth daily as needed (acid reflux).   rosuvastatin (CRESTOR) 10 MG tablet Take 10 mg by mouth daily.    sertraline (ZOLOFT) 50 MG tablet Take 50 mg by mouth daily.   No current facility-administered medications for this visit. (Other)   REVIEW OF SYSTEMS: ROS   Positive for: Gastrointestinal, Endocrine, Eyes, Psychiatric Negative for: Constitutional, Neurological, Skin, Genitourinary, Musculoskeletal, HENT, Cardiovascular, Respiratory, Allergic/Imm, Heme/Lymph Last edited by [name], COT on 04/02/2022  8:25 AM.      ALLERGIES Allergies  Allergen Reactions   Codeine Nausea And Vomiting   PAST MEDICAL HISTORY Past Medical History:  Diagnosis Date   Arthritis    Depression    Diabetes mellitus without complication ([place])    GERD (gastroesophageal reflux disease)    PONV (postoperative nausea and vomiting)    Past Surgical History:  Procedure Laterality Date   ABDOMINAL HYSTERECTOMY     AIR/FLUID EXCHANGE Left 01/08/2022   Procedure: AIR/FLUID EXCHANGE;  Surgeon: Bernarda Caffey, MD;  Location: [place] [facility];  Service: Ophthalmology;  Laterality: Left;   CHOLECYSTECTOMY     GAS INSERTION Left 01/08/2022   Procedure: INSERTION OF GAS C3F8;  Surgeon: Bernarda Caffey, MD;  Location: [place];  Service: Ophthalmology;  Laterality: Left;   PERFLUORONE INJECTION Left 01/08/2022   Procedure: PERFLUORON INJECTION;  Surgeon: Bernarda Caffey, MD;  Location: [facility];  Service: Ophthalmology;  Laterality: Left;   PHOTOCOAGULATION WITH LASER Left 01/08/2022   Procedure: PHOTOCOAGULATION WITH LASER;  Surgeon: Bernarda Caffey, MD;  Location:  [facility];  Service: Ophthalmology;  Laterality: Left;   VITRECTOMY 25 GAUGE WITH SCLERAL BUCKLE Left 01/08/2022   Procedure: TWENTY-FIVE GAUGE PARS PLANA VITRECTOMY WITH SCLERAL BUCKLE;  Surgeon: Bernarda Caffey, MD;  Location: [facility];  Service: Ophthalmology;  Laterality: Left;   FAMILY HISTORY History reviewed. No pertinent family history.  SOCIAL HISTORY Social History   Tobacco Use   Smoking status: Never   Smokeless tobacco: Never  Vaping Use   Vaping Use: Never  used  Substance Use Topics   Alcohol use: Never   Drug use: Never       OPHTHALMIC EXAM:  Base Eye Exam     Visual Acuity (Snellen - Linear)       Right Left   Dist cc 20/20 -2 CF at 3'   Dist ph cc  20/80         Tonometry (Tonopen, 8:34 AM)       Right Left   Pressure 14 14         Pupils       Dark Light Shape React APD   Right 4 3 Round Brisk None   Left 7 7 Round           Visual Fields       Left Right     Full   Restrictions Partial outer inferior temporal, inferior nasal deficiencies          Extraocular Movement       Right Left    Full, Ortho Full, Ortho         Neuro/Psych     Oriented x3: Yes   Mood/Affect: Normal         Dilation     Both eyes: 1.0% Mydriacyl, 2.5% Phenylephrine @ 8:27 AM           Slit Lamp and Fundus Exam     Slit Lamp Exam       Right Left   Lids/Lashes Dermatochalasis - upper lid, mild MGD Dermatochalasis - upper lid, mild MGD, lid edema -- improved   Conjunctiva/Sclera White and quiet White and quiet   Cornea mild arcus, trace PEE trace fine Punctate epithelial erosions, mild arcus, mild EBMD   Anterior Chamber Deep and quiet Deep, narrow angles, 0.5+cell   Iris Round and dilated, No NVI irregular pupil, No NVI, Posterior synechiae 0730   Lens 2+ Nuclear sclerosis, 2+ Cortical cataract 2-3+ Nuclear sclerosis with brunscence, 2+ Cortical cataract, 2-3+ PSC   Anterior Vitreous Vitreous syneresis post vitrectomy, gas bubble not visible         Fundus Exam       Right Left   Disc Pink and Sharp Pink and Sharp   C/D Ratio 0.3 0.3   Macula Flat, Blunted foveal reflex, RPE mottling, mild drusen, mild ERM, No heme or edema Flat, attached, blunted foveal reflex, rare, fine drusen, RPE mottling, trace ERM   Vessels mild attenuation attenuated   Periphery Attached, No RT/RD, No heme Attached over buckle, good buckle height, good laser over buckle and around tears, PRE-OP: Bullous ST detachment from  1100-0400 with small holes at 1200           Refraction     Wearing Rx       Sphere Cylinder Axis Add   Right +1.25 +1.00 015 +2.25   Left +1.50 Sphere  +2.25         Manifest Refraction       Sphere Cylinder Axis Dist VA Add  Right +1.25 +1.00 015  +2.25   Left [place] +0.25 005 20/100+2 +2.25           IMAGING AND PROCEDURES  Imaging and Procedures for 04/02/2022  OCT, Retina - OU - Both Eyes       Right Eye Quality was good. Central Foveal Thickness: 254. Progression has been stable. Findings include normal foveal contour, no IRF, no SRF, vitreomacular adhesion .   Left Eye Quality was good. Central Foveal Thickness: 238. Progression has been stable. Findings include normal foveal contour, no IRF, subretinal fluid (Retina re-attached; Scattered focal pockets of SRF SN macula and superior periphery seen best on widefield).   Notes *Images captured and stored on drive  Diagnosis / Impression:  OD: NFP, no IRF/SRF OS: Retina re-attached; Scattered focal pockets of SRF SN macula and superior periphery seen best on widefield  Clinical management:  See below  Abbreviations: NFP - Normal foveal profile. CME - cystoid macular edema. PED - pigment epithelial detachment. IRF - intraretinal fluid. SRF - subretinal fluid. EZ - ellipsoid zone. ERM - epiretinal membrane. ORA - outer retinal atrophy. ORT - outer retinal tubulation. SRHM - subretinal hyper-reflective material. IRHM - intraretinal hyper-reflective material            ASSESSMENT/PLAN:    ICD-10-CM   1. Left retinal detachment  H33.22 OCT, Retina - OU - Both Eyes    2. Diabetes mellitus type 2 without retinopathy ([place])  E11.9     3. 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, onset of foveal involvement about 1-2 week ago by pt history - detached from 11 to 4 oclock, fovea off, microtears ~130 oclock  within areas of very thin retina - POW12 s/p PPV/PFO/EL/FAX/14% C3F8 OS, 05.18.2023             - doing well             - retina attached and in good position -- good buckle height and laser around breaks  - fibrin rxn stably resolved -- pt had switched PF and atropine drop instructions -- focal PS at 0730             - IOP okay at 14  - gas bubble not visible             - start PF taper -- 2,1 drops daily, decrease weekly             - no positioning restrictions  -- clear to return to mild activities             - post op drop and positioning instructions reviewed              - tylenol/ibuprofen for pain              - Rx given for breakthrough pain  - f/u 6 weeks, DFE, OCT  2. Diabetes mellitus, type 2 without retinopathy - The incidence, risk factors for progression, natural history and treatment options for diabetic retinopathy  were discussed with patient.   - The need for close monitoring of blood glucose, blood pressure, and serum lipids, avoiding cigarette or any type of tobacco, and the need for long term follow up was also discussed with patient.  - f/u in 1 year, sooner prn  3. Mixed Cataract OU - The symptoms of cataract, surgical options, and treatments and risks were  discussed with patient. - discussed diagnosis and progression -- specifically post vitrectomy progression - will refer to Dr. Ellie Lunch for cataract consult  Ophthalmic Meds Ordered this visit:  No orders of the defined types were placed in this encounter.    Return in about 6 weeks (around 05/14/2022) for f/u RD OS, DFE, OCT.  There are no Patient Instructions on file for this visit.  Explained the diagnoses, plan, and follow up with the patient and they expressed understanding.  Patient expressed understanding of the importance of proper follow up care.   This document serves as a record of services personally performed by Gardiner Sleeper, MD, PhD. It was created on their behalf by San Jetty. Owens Shark, OA an  ophthalmic technician. The creation of this record is the provider's dictation and/or activities during the visit.    Electronically signed by: San Jetty. Owens Shark, [place] 08.01.2023 1:35 AM   Gardiner Sleeper, M.D., Ph.D. Diseases & Surgery of the Retina and Vitreous [facility]  I have reviewed the above documentation for accuracy and completeness, and I agree with the above. Gardiner Sleeper, M.D., Ph.D. 04/03/22 1:38 AM   Abbreviations: M myopia (nearsighted); A astigmatism; H hyperopia (farsighted); P presbyopia; Mrx spectacle prescription;  CTL contact lenses; OD right eye; OS left eye; OU both eyes  XT exotropia; ET esotropia; PEK punctate epithelial keratitis; PEE punctate epithelial erosions; DES dry eye syndrome; MGD meibomian gland dysfunction; ATs artificial tears; PFAT's preservative free artificial tears; [place] nuclear sclerotic cataract; PSC posterior subcapsular cataract; ERM epi-retinal membrane; PVD posterior vitreous detachment; RD retinal detachment; DM diabetes mellitus; DR diabetic retinopathy; NPDR non-proliferative diabetic retinopathy; PDR proliferative diabetic retinopathy; CSME clinically significant macular edema; DME diabetic macular edema; dbh dot blot hemorrhages; CWS cotton wool spot; POAG primary open angle glaucoma; C/D cup-to-disc ratio; HVF humphrey visual field; GVF goldmann visual field; OCT optical coherence tomography; IOP intraocular pressure; BRVO Branch retinal vein occlusion; CRVO central retinal vein occlusion; CRAO central retinal artery occlusion; BRAO branch retinal artery occlusion; RT retinal tear; SB scleral buckle; PPV pars plana vitrectomy; VH Vitreous hemorrhage; PRP panretinal laser photocoagulation; IVK intravitreal kenalog; VMT vitreomacular traction; MH Macular hole;  NVD neovascularization of the disc; NVE neovascularization elsewhere; AREDS age related eye disease study; ARMD age related macular degeneration; POAG primary open angle  glaucoma; EBMD epithelial/anterior basement membrane dystrophy; ACIOL anterior chamber intraocular lens; IOL intraocular lens; PCIOL posterior chamber intraocular lens; Phaco/IOL phacoemulsification with intraocular lens placement; [place] photorefractive keratectomy; LASIK laser assisted in situ keratomileusis; HTN hypertension; DM diabetes mellitus; COPD chronic obstructive pulmonary disease

## 2022-04-02 ENCOUNTER — Ambulatory Visit (INDEPENDENT_AMBULATORY_CARE_PROVIDER_SITE_OTHER): Payer: Medicare Other | Admitting: Ophthalmology

## 2022-04-02 DIAGNOSIS — E119 Type 2 diabetes mellitus without complications: Secondary | ICD-10-CM

## 2022-04-02 DIAGNOSIS — H3322 Serous retinal detachment, left eye: Secondary | ICD-10-CM | POA: Diagnosis not present

## 2022-04-02 DIAGNOSIS — H25813 Combined forms of age-related cataract, bilateral: Secondary | ICD-10-CM

## 2022-04-03 ENCOUNTER — Encounter: Payer: Self-pay | Admitting: Emergency Medicine

## 2022-04-03 ENCOUNTER — Other Ambulatory Visit: Payer: Self-pay

## 2022-04-03 ENCOUNTER — Encounter (INDEPENDENT_AMBULATORY_CARE_PROVIDER_SITE_OTHER): Payer: Self-pay | Admitting: Ophthalmology

## 2022-04-03 ENCOUNTER — Ambulatory Visit
Admission: EM | Admit: 2022-04-03 | Discharge: 2022-04-03 | Disposition: A | Discharge status: Home / Self Care | Payer: Medicare Other | Attending: Urgent Care | Admitting: Urgent Care

## 2022-04-03 DIAGNOSIS — M79621 Pain in right upper arm: Secondary | ICD-10-CM | POA: Diagnosis not present

## 2022-04-03 DIAGNOSIS — Z23 Encounter for immunization: Secondary | ICD-10-CM

## 2022-04-03 DIAGNOSIS — S41111A Laceration without foreign body of right upper arm, initial encounter: Secondary | ICD-10-CM

## 2022-04-03 MED ORDER — TETANUS-DIPHTH-ACELL PERTUSSIS 5-2.5-18.5 LF-MCG/0.5 IM SUSY
0.5000 mL | PREFILLED_SYRINGE | Freq: Once | INTRAMUSCULAR | Status: AC
  Administered 2022-04-03: 0.5 mL via INTRAMUSCULAR

## 2022-04-03 NOTE — ED Triage Notes (Signed)
Pt here after trip and fall today catching right upper arm on a nail; wound noted to right arm; bleeding controlled

## 2022-04-03 NOTE — ED Provider Notes (Signed)
[facility]   MRN: [id number] DOB: 01-02-1951  Subjective:   Melody Freeman is a 71 y.o. female presenting for suffering an accidental fall that caused her to injure her right upper arm against a nail.  Denies loss consciousness, headache, confusion, vision changes.  She was not able to clean up the wound.  Cannot recall when her last Tdap was.  No current facility-administered medications for this encounter.  Current Outpatient Medications:    acetaminophen (TYLENOL) 500 MG tablet, Take 1,000 mg by mouth every 6 (six) hours as needed., Disp: , Rfl:    HYDROcodone-acetaminophen (NORCO/VICODIN) 5-325 MG tablet, Take 1 tablet by mouth every 4 (four) hours as needed for moderate pain. (Patient not taking: Reported on 03/05/2022), Disp: 20 tablet, Rfl: 0   ibuprofen (ADVIL) 200 MG tablet, Take 600 mg by mouth every 6 (six) hours as needed for headache or moderate pain., Disp: , Rfl:    metFORMIN (GLUCOPHAGE-XR) 500 MG 24 hr tablet, Take 1,000 mg by mouth 2 (two) times daily., Disp: , Rfl:    omeprazole (PRILOSEC) 20 MG capsule, Take 20 mg by mouth daily as needed (acid reflux)., Disp: , Rfl:    prednisoLONE acetate (PRED FORTE) 1 % ophthalmic suspension, Place 1 drop into the left eye 3 (three) times daily., Disp: 15 mL, Rfl: 0   rosuvastatin (CRESTOR) 10 MG tablet, Take 10 mg by mouth daily., Disp: , Rfl:    sertraline (ZOLOFT) 50 MG tablet, Take 50 mg by mouth daily., Disp: , Rfl:    Allergies  Allergen Reactions   Codeine Nausea And Vomiting    Past Medical History:  Diagnosis Date   Arthritis    Depression    Diabetes mellitus without complication ([facility])    GERD (gastroesophageal reflux disease)    PONV (postoperative nausea and vomiting)      Past Surgical History:  Procedure Laterality Date   ABDOMINAL HYSTERECTOMY     AIR/FLUID EXCHANGE Left 01/08/2022   Procedure: AIR/FLUID EXCHANGE;  Surgeon: [name], MD;  Location[facility] OR;  Service: Ophthalmology;   Laterality: Left;   CHOLECYSTECTOMY     GAS INSERTION Left 01/08/2022   Procedure: INSERTION OF GAS C3F8;  Surgeon: [name], MD;  Location: [facility] OR;  Service: Ophthalmology;  Laterality: Left;   PERFLUORONE INJECTION Left 01/08/2022   Procedure: PERFLUORON INJECTION;  Surgeon: [name], MD;  Location: [facility] OR;  Service: Ophthalmology;  Laterality: Left;   PHOTOCOAGULATION WITH LASER Left 01/08/2022   Procedure: PHOTOCOAGULATION WITH LASER;  Surgeon: [name], MD;  Location: [facility] OR;  Service: Ophthalmology;  Laterality: Left;   VITRECTOMY 25 GAUGE WITH SCLERAL BUCKLE Left 01/08/2022   Procedure: TWENTY-FIVE GAUGE PARS PLANA VITRECTOMY WITH SCLERAL BUCKLE;  Surgeon: [name], MD;  Location: [facility] OR;  Service: Ophthalmology;  Laterality: Left;    No family history on file.  Social History   Tobacco Use   Smoking status: Never   Smokeless tobacco: Never  Vaping Use   Vaping Use: Never used  Substance Use Topics   Alcohol use: Never   Drug use: Never    ROS   Objective:   Vitals: BP 132/66 (BP Location: Left Arm)   Pulse 82   Temp 98.2 F (36.8 C) (Oral)   Resp 18   SpO2 96%   Physical Exam Constitutional:      General: She is not in acute distress.    Appearance: Normal appearance. She is well-developed and normal weight. She is not ill-appearing, toxic-appearing or  diaphoretic.  HENT:     Head: Normocephalic and atraumatic.     Right Ear: Tympanic membrane, ear canal and external ear normal. No drainage or tenderness. No middle ear effusion. There is no impacted cerumen. Tympanic membrane is not erythematous.     Left Ear: Tympanic membrane, ear canal and external ear normal. No drainage or tenderness.  No middle ear effusion. There is no impacted cerumen. Tympanic membrane is not erythematous.     Nose: Nose normal. No congestion or rhinorrhea.     Mouth/Throat:     Mouth: Mucous membranes are moist. No oral lesions.     Pharynx: No pharyngeal swelling,  oropharyngeal exudate, posterior oropharyngeal erythema or uvula swelling.     Tonsils: No tonsillar exudate or tonsillar abscesses.  Eyes:     General: No scleral icterus.       Right eye: No discharge.        Left eye: No discharge.     Extraocular Movements: Extraocular movements intact.     Right eye: Normal extraocular motion.     Left eye: Normal extraocular motion.     Conjunctiva/sclera: Conjunctivae normal.  Cardiovascular:     Rate and Rhythm: Normal rate.  Pulmonary:     Effort: Pulmonary effort is normal.  Musculoskeletal:       Arms:     Cervical back: Normal range of motion and neck supple.  Lymphadenopathy:     Cervical: No cervical adenopathy.  Skin:    General: Skin is warm and dry.  Neurological:     General: No focal deficit present.     Mental Status: She is alert and oriented to person, place, and time.     Cranial Nerves: No cranial nerve deficit.     Motor: No weakness.     Coordination: Coordination normal.     Gait: Gait normal.     Deep Tendon Reflexes: Reflexes normal.  Psychiatric:        Mood and Affect: Mood normal.        Behavior: Behavior normal.        Thought Content: Thought content normal.    Tdap updated in clinic.  PROCEDURE NOTE: laceration repair Verbal consent obtained from patient.  Local anesthesia with 8cc Lidocaine 2% with epinephrine.  Wound explored for tendon, ligament damage. Wound scrubbed with soap and water and rinsed. Wound closed with #5 4-0 Prolene (simple interrupted) sutures.  Wound cleansed and dressed.  Assessment and Plan :   PDMP not reviewed this encounter.  1. Pain in right upper arm   2. Arm laceration, right, initial encounter    No signs of an acute intracranial injury.  Laceration repaired successfully.  Wound care reviewed.  Recommended return to clinic in 10 days for suture removal.  Discussed warning signs and symptoms of wound infection.  Use Tylenol for pain relief. Counseled patient on potential  for adverse effects with medications prescribed/recommended today, ER and return-to-clinic precautions discussed, patient verbalized understanding.    Wallis Bamberg, PA-C 04/03/22 1742

## 2022-04-03 NOTE — Discharge Instructions (Addendum)

## 2022-04-13 ENCOUNTER — Ambulatory Visit
Admission: EM | Admit: 2022-04-13 | Discharge: 2022-04-13 | Disposition: A | Discharge status: Home / Self Care | Payer: Medicare Other | Attending: Family Medicine | Admitting: Family Medicine

## 2022-04-13 NOTE — ED Triage Notes (Signed)
Pt here for suture removal, 5 sutures removed from right arm, skin approximated well

## 2022-05-08 DIAGNOSIS — H2512 Age-related nuclear cataract, left eye: Secondary | ICD-10-CM | POA: Diagnosis not present

## 2022-05-08 DIAGNOSIS — H35372 Puckering of macula, left eye: Secondary | ICD-10-CM | POA: Diagnosis not present

## 2022-05-08 NOTE — Progress Notes (Signed)
[facility] Note  05/14/2022     CHIEF COMPLAINT Patient presents for Retina Follow Up   HISTORY OF PRESENT ILLNESS: Melody Freeman is a 71 y.o. female who presents to the clinic today for:   HPI     Retina Follow Up   Patient presents with  Retinal Break/Detachment.  In left eye.  This started months ago.  Duration of 6 weeks.  I, the attending physician,  performed the HPI with the patient and updated documentation appropriately.        Comments   Patient states that at times she sees double out of the left eye when she's watching TV. She is having cataract surgery on the left eye next week.       Last edited by Bernarda Caffey, MD on 05/14/2022  8:26 AM.    Pt saw Dr. Ellie Lunch last week and is going to have left eye cataract sx next Thursday, she states in the past couple of days she has noticed double vision only when watching TV, she is not using any drops at this time  Referring physician: Sonoita Nation, MD [place],   [id number]  HISTORICAL INFORMATION:   Selected notes from the MEDICAL RECORD NUMBER Referred by Dr. Marin Comment for concern of RD OS LEE:  Ocular Hx- PMH-    CURRENT MEDICATIONS: Current Outpatient Medications (Ophthalmic Drugs)  Medication Sig   prednisoLONE acetate (PRED FORTE) 1 % ophthalmic suspension Place 1 drop into the left eye 3 (three) times daily.   No current facility-administered medications for this visit. (Ophthalmic Drugs)   Current Outpatient Medications (Other)  Medication Sig   acetaminophen (TYLENOL) 500 MG tablet Take 1,000 mg by mouth every 6 (six) hours as needed.   HYDROcodone-acetaminophen (NORCO/VICODIN) 5-325 MG tablet Take 1 tablet by mouth every 4 (four) hours as needed for moderate pain. (Patient not taking: Reported on 03/05/2022)   ibuprofen (ADVIL) 200 MG tablet Take 600 mg by mouth every 6 (six) hours as needed for headache or moderate pain.   metFORMIN (GLUCOPHAGE-XR) 500 MG  24 hr tablet Take 1,000 mg by mouth 2 (two) times daily.   omeprazole (PRILOSEC) 20 MG capsule Take 20 mg by mouth daily as needed (acid reflux).   rosuvastatin (CRESTOR) 10 MG tablet Take 10 mg by mouth daily.   sertraline (ZOLOFT) 50 MG tablet Take 50 mg by mouth daily.   No current facility-administered medications for this visit. (Other)   REVIEW OF SYSTEMS: ROS   Positive for: Gastrointestinal, Endocrine, Eyes, Psychiatric Negative for: Constitutional, Neurological, Skin, Genitourinary, Musculoskeletal, HENT, Cardiovascular, Respiratory, Allergic/Imm, Heme/Lymph Last edited by [name], COT on 05/14/2022  7:35 AM.     ALLERGIES Allergies  Allergen Reactions   Codeine Nausea And Vomiting   PAST MEDICAL HISTORY Past Medical History:  Diagnosis Date   Arthritis    Depression    Diabetes mellitus without complication ([place])    GERD (gastroesophageal reflux disease)    PONV (postoperative nausea and vomiting)    Past Surgical History:  Procedure Laterality Date   ABDOMINAL HYSTERECTOMY     AIR/FLUID EXCHANGE Left 01/08/2022   Procedure: AIR/FLUID EXCHANGE;  Surgeon: Bernarda Caffey, MD;  Location: [place];  Service: Ophthalmology;  Laterality: Left;   CHOLECYSTECTOMY     GAS INSERTION Left 01/08/2022   Procedure: INSERTION OF GAS C3F8;  Surgeon: Bernarda Caffey, MD;  Location: [place];  Service: Ophthalmology;  Laterality: Left;   PERFLUORONE  INJECTION Left 01/08/2022   Procedure: PERFLUORON INJECTION;  Surgeon: Bernarda Caffey, MD;  Location: [place];  Service: Ophthalmology;  Laterality: Left;   PHOTOCOAGULATION WITH LASER Left 01/08/2022   Procedure: PHOTOCOAGULATION WITH LASER;  Surgeon: Bernarda Caffey, MD;  Location: [place];  Service: Ophthalmology;  Laterality: Left;   VITRECTOMY 25 GAUGE WITH SCLERAL BUCKLE Left 01/08/2022   Procedure: TWENTY-FIVE GAUGE PARS PLANA VITRECTOMY WITH SCLERAL BUCKLE;  Surgeon: Bernarda Caffey, MD;  Location: [place];  Service: Ophthalmology;   Laterality: Left;   FAMILY HISTORY History reviewed. No pertinent family history.  SOCIAL HISTORY Social History   Tobacco Use   Smoking status: Never   Smokeless tobacco: Never  Vaping Use   Vaping Use: Never used  Substance Use Topics   Alcohol use: Never   Drug use: Never       OPHTHALMIC EXAM:  Base Eye Exam     Visual Acuity (Snellen - Linear)       Right Left   Dist cc 20/20 +2 CF at 3'   Dist ph cc  20/80 -2    Correction: Glasses         Tonometry (Tonopen, 7:43 AM)       Right Left   Pressure 11 9         Pupils       Dark Light Shape React APD   Right 3 3 Round Minimal None   Left 7 7 Round Minimal None         Visual Fields       Left Right     Full   Restrictions Partial outer inferior temporal, inferior nasal deficiencies          Extraocular Movement       Right Left    Full, Ortho Full, Ortho         Neuro/Psych     Oriented x3: Yes   Mood/Affect: Normal         Dilation     Right eye:            Slit Lamp and Fundus Exam     Slit Lamp Exam       Right Left   Lids/Lashes Dermatochalasis - upper lid, mild MGD Dermatochalasis - upper lid, mild MGD, lid edema -- improved   Conjunctiva/Sclera White and quiet White and quiet   Cornea mild arcus, trace PEE trace fine Punctate epithelial erosions, mild arcus, mild EBMD   Anterior Chamber Deep and quiet Deep, narrow angles, 0.5+cell   Iris Round and dilated, No NVI irregular pupil, No NVI, Posterior synechiae 0730   Lens 2+ Nuclear sclerosis, 2+ Cortical cataract 2-3+ Nuclear sclerosis with brunscence, 2+ Cortical cataract, 2-3+ PSC   Anterior Vitreous Vitreous syneresis post vitrectomy, clear         Fundus Exam       Right Left   Disc Pink and Sharp Pink and Sharp   C/D Ratio 0.3 0.3   Macula Flat, Blunted foveal reflex, RPE mottling, mild drusen, mild ERM, No heme or edema Flat, attached, blunted foveal reflex, rare, fine drusen, RPE mottling, trace  ERM   Vessels mild attenuation attenuated, mild tortuosity   Periphery Attached, No RT/RD, No heme Attached over buckle, good buckle height, good laser over buckle and around tears, PRE-OP: Bullous ST detachment from 1100-0400 with small holes at 1200           Refraction     Wearing Rx  Sphere Cylinder Axis Add   Right +1.25 +1.00 015 +2.25   Left +1.50 Sphere  +2.25         Manifest Refraction       Sphere Cylinder Axis Dist VA Add   Right +1.25 +1.00 015  +2.25   Left -1.50 Sphere  20/70 +2.25           IMAGING AND PROCEDURES  Imaging and Procedures for 05/14/2022  OCT, Retina - OU - Both Eyes       Right Eye Quality was good. Central Foveal Thickness: 252. Progression has been stable. Findings include normal foveal contour, no IRF, no SRF, vitreomacular adhesion .   Left Eye Quality was good. Central Foveal Thickness: 231. Progression has improved. Findings include normal foveal contour, no IRF, subretinal fluid (Retina re-attached; Scattered focal pockets of shallow SRF SN macula and superior periphery seen best on widefield -- improving).   Notes *Images captured and stored on drive  Diagnosis / Impression:  OD: NFP, no IRF/SRF OS: Retina re-attached; Scattered focal pockets of shallow SRF SN macula and superior periphery seen best on widefield -- improving  Clinical management:  See below  Abbreviations: NFP - Normal foveal profile. CME - cystoid macular edema. PED - pigment epithelial detachment. IRF - intraretinal fluid. SRF - subretinal fluid. EZ - ellipsoid zone. ERM - epiretinal membrane. ORA - outer retinal atrophy. ORT - outer retinal tubulation. SRHM - subretinal hyper-reflective material. IRHM - intraretinal hyper-reflective material            ASSESSMENT/PLAN:    ICD-10-CM   1. Left retinal detachment  H33.22 OCT, Retina - OU - Both Eyes    2. Diabetes mellitus type 2 without retinopathy ([place])  E11.9     3. Combined forms of  age-related cataract of both eyes  H25.813      Rhegmatogenous retinal detachment, OS - bullous ST mac off detachment, onset of foveal involvement about 1-2 week ago by pt history - detached from 11 to 4 oclock, fovea off, microtears ~130 oclock within areas of very thin retina - s/p PPV/PFO/EL/FAX/14% C3F8 OS, 05.18.2023             - doing well             - retina attached and in good position -- good buckle height and laser around breaks  - fibrin rxn stably resolved -- pt had switched PF and atropine drop instructions -- focal PS at 0730             - IOP okay at 9  - gas bubble gone             - completed PF taper              - no positioning restrictions  -- clear to return to full activities  - f/u 2 months, DFE, OCT  2. Diabetes mellitus, type 2 without retinopathy - The incidence, risk factors for progression, natural history and treatment options for diabetic retinopathy  were discussed with patient.   - The need for close monitoring of blood glucose, blood pressure, and serum lipids, avoiding cigarette or any type of tobacco, and the need for long term follow up was also discussed with patient.  - f/u in 1 year, sooner prn  3. Mixed Cataract OU - The symptoms of cataract, surgical options, and treatments and risks were discussed with patient. - discussed diagnosis and progression -- specifically post vitrectomy progression - cataract sx  OS scheduled for September 28 with Dr. Ellie Lunch  Ophthalmic Meds Ordered this visit:  No orders of the defined types were placed in this encounter.    Return in about 2 months (around 07/14/2022) for f/u RD OS, DFE, OCT.  There are no Patient Instructions on file for this visit.  Explained the diagnoses, plan, and follow up with the patient and they expressed understanding.  Patient expressed understanding of the importance of proper follow up care.   This document serves as a record of services personally performed by Gardiner Sleeper,  MD, PhD. It was created on their behalf by San Jetty. Owens Shark, OA an ophthalmic technician. The creation of this record is the provider's dictation and/or activities during the visit.    Electronically signed by: San Jetty. Owens Shark, [place] 09.15.2023 8:26 AM  Gardiner Sleeper, M.D., Ph.D. Diseases & Surgery of the Retina and Vitreous [facility]  I have reviewed the above documentation for accuracy and completeness, and I agree with the above. Gardiner Sleeper, M.D., Ph.D. 05/14/22 8:27 AM   Abbreviations: M myopia (nearsighted); A astigmatism; H hyperopia (farsighted); P presbyopia; Mrx spectacle prescription;  CTL contact lenses; OD right eye; OS left eye; OU both eyes  XT exotropia; ET esotropia; PEK punctate epithelial keratitis; PEE punctate epithelial erosions; DES dry eye syndrome; MGD meibomian gland dysfunction; ATs artificial tears; PFAT's preservative free artificial tears; [place] nuclear sclerotic cataract; PSC posterior subcapsular cataract; ERM epi-retinal membrane; PVD posterior vitreous detachment; RD retinal detachment; DM diabetes mellitus; DR diabetic retinopathy; NPDR non-proliferative diabetic retinopathy; PDR proliferative diabetic retinopathy; CSME clinically significant macular edema; DME diabetic macular edema; dbh dot blot hemorrhages; CWS cotton wool spot; POAG primary open angle glaucoma; C/D cup-to-disc ratio; HVF humphrey visual field; GVF goldmann visual field; OCT optical coherence tomography; IOP intraocular pressure; BRVO Branch retinal vein occlusion; CRVO central retinal vein occlusion; CRAO central retinal artery occlusion; BRAO branch retinal artery occlusion; RT retinal tear; SB scleral buckle; PPV pars plana vitrectomy; VH Vitreous hemorrhage; PRP panretinal laser photocoagulation; IVK intravitreal kenalog; VMT vitreomacular traction; MH Macular hole;  NVD neovascularization of the disc; NVE neovascularization elsewhere; AREDS age related eye disease study;  ARMD age related macular degeneration; POAG primary open angle glaucoma; EBMD epithelial/anterior basement membrane dystrophy; ACIOL anterior chamber intraocular lens; IOL intraocular lens; PCIOL posterior chamber intraocular lens; Phaco/IOL phacoemulsification with intraocular lens placement; [place] photorefractive keratectomy; LASIK laser assisted in situ keratomileusis; HTN hypertension; DM diabetes mellitus; COPD chronic obstructive pulmonary disease

## 2022-05-14 ENCOUNTER — Encounter (INDEPENDENT_AMBULATORY_CARE_PROVIDER_SITE_OTHER): Payer: Self-pay | Admitting: Ophthalmology

## 2022-05-14 ENCOUNTER — Ambulatory Visit (INDEPENDENT_AMBULATORY_CARE_PROVIDER_SITE_OTHER): Payer: Medicare Other | Admitting: Ophthalmology

## 2022-05-14 DIAGNOSIS — H3322 Serous retinal detachment, left eye: Secondary | ICD-10-CM | POA: Diagnosis not present

## 2022-05-14 DIAGNOSIS — H25813 Combined forms of age-related cataract, bilateral: Secondary | ICD-10-CM

## 2022-05-14 DIAGNOSIS — E119 Type 2 diabetes mellitus without complications: Secondary | ICD-10-CM

## 2022-05-21 DIAGNOSIS — H269 Unspecified cataract: Secondary | ICD-10-CM | POA: Diagnosis not present

## 2022-05-21 DIAGNOSIS — H2512 Age-related nuclear cataract, left eye: Secondary | ICD-10-CM | POA: Diagnosis not present

## 2022-06-30 NOTE — Progress Notes (Addendum)
[facility] Note  07/14/2022     CHIEF COMPLAINT Patient presents for Retina Follow Up   HISTORY OF PRESENT ILLNESS: Melody Freeman is a 71 y.o. female who presents to the clinic today for:   HPI     Retina Follow Up   Patient presents with  Retinal Break/Detachment.  In left eye.  Severity is moderate.  Duration of 2 months.  Since onset it is stable.  I, the attending physician,  performed the HPI with the patient and updated documentation appropriately.        Comments   Pt here for RD OS. Pt states VA has improved OS since cataract surgery on 9.28.23 w/ Dr. Ellie Lunch. No other changes or issues, OD VA stable. Has new rx specs.       Last edited by Bernarda Caffey, MD on 07/14/2022  8:09 AM.    Pt has had cataract sx OS with Dr. Ellie Lunch and gotten new glasses since she was here last, pt recently fell and hit her head   Referring physician: Vienna Nation, MD [place],  [place] [id number]  HISTORICAL INFORMATION:   Selected notes from the MEDICAL RECORD NUMBER Referred by Dr. Marin Comment for concern of RD OS LEE:  Ocular Hx- PMH-    CURRENT MEDICATIONS: Current Outpatient Medications (Ophthalmic Drugs)  Medication Sig   prednisoLONE acetate (PRED FORTE) 1 % ophthalmic suspension Place 1 drop into the left eye 3 (three) times daily.   No current facility-administered medications for this visit. (Ophthalmic Drugs)   Current Outpatient Medications (Other)  Medication Sig   acetaminophen (TYLENOL) 500 MG tablet Take 1,000 mg by mouth every 6 (six) hours as needed.   ibuprofen (ADVIL) 200 MG tablet Take 600 mg by mouth every 6 (six) hours as needed for headache or moderate pain.   metFORMIN (GLUCOPHAGE-XR) 500 MG 24 hr tablet Take 1,000 mg by mouth 2 (two) times daily.   omeprazole (PRILOSEC) 20 MG capsule Take 20 mg by mouth daily as needed (acid reflux).   rosuvastatin (CRESTOR) 10 MG tablet Take 10 mg by mouth daily.   sertraline  (ZOLOFT) 50 MG tablet Take 50 mg by mouth daily.   HYDROcodone-acetaminophen (NORCO/VICODIN) 5-325 MG tablet Take 1 tablet by mouth every 4 (four) hours as needed for moderate pain. (Patient not taking: Reported on 07/14/2022)   No current facility-administered medications for this visit. (Other)   REVIEW OF SYSTEMS: ROS   Positive for: Gastrointestinal, Endocrine, Eyes, Psychiatric Negative for: Constitutional, Neurological, Skin, Genitourinary, Musculoskeletal, HENT, Cardiovascular, Respiratory, Allergic/Imm, Heme/Lymph Last edited by Kingsley Spittle, COT on 07/14/2022  7:55 AM.      ALLERGIES Allergies  Allergen Reactions   Codeine Nausea And Vomiting   PAST MEDICAL HISTORY Past Medical History:  Diagnosis Date   Arthritis    Depression    Diabetes mellitus without complication ([place])    GERD (gastroesophageal reflux disease)    PONV (postoperative nausea and vomiting)    Past Surgical History:  Procedure Laterality Date   ABDOMINAL HYSTERECTOMY     AIR/FLUID EXCHANGE Left 01/08/2022   Procedure: AIR/FLUID EXCHANGE;  Surgeon: Bernarda Caffey, MD;  Location: [place];  Service: Ophthalmology;  Laterality: Left;   CHOLECYSTECTOMY     GAS INSERTION Left 01/08/2022   Procedure: INSERTION OF GAS C3F8;  Surgeon: Bernarda Caffey, MD;  Location: [place];  Service: Ophthalmology;  Laterality: Left;   PERFLUORONE INJECTION Left 01/08/2022   Procedure: PERFLUORON INJECTION;  Surgeon: Bernarda Caffey, MD;  Location: [facility];  Service: Ophthalmology;  Laterality: Left;   PHOTOCOAGULATION WITH LASER Left 01/08/2022   Procedure: PHOTOCOAGULATION WITH LASER;  Surgeon: Bernarda Caffey, MD;  Location: [place];  Service: Ophthalmology;  Laterality: Left;   VITRECTOMY 25 GAUGE WITH SCLERAL BUCKLE Left 01/08/2022   Procedure: TWENTY-FIVE GAUGE PARS PLANA VITRECTOMY WITH SCLERAL BUCKLE;  Surgeon: Bernarda Caffey, MD;  Location: [place] [facility];  Service: Ophthalmology;  Laterality: Left;   FAMILY HISTORY History reviewed.  No pertinent family history.  SOCIAL HISTORY Social History   Tobacco Use   Smoking status: Never   Smokeless tobacco: Never  Vaping Use   Vaping Use: Never used  Substance Use Topics   Alcohol use: Never   Drug use: Never       OPHTHALMIC EXAM:  Base Eye Exam     Visual Acuity (Snellen - Linear)       Right Left   Dist cc 20/20 -1 20/40 +1   Dist ph cc  NI    Correction: Glasses         Tonometry (Tonopen, 8:01 AM)       Right Left   Pressure 10 17         Pupils       Dark Light Shape React APD   Right 4 3 Round Brisk None   Left 6 6 Round NR None         Visual Fields       Left Right    Full Full         Extraocular Movement       Right Left    Full, Ortho Full, Ortho         Neuro/Psych     Oriented x3: Yes   Mood/Affect: Normal         Dilation     Both eyes: 1.0% Mydriacyl, 2.5% Phenylephrine @ 8:01 AM           Slit Lamp and Fundus Exam     Slit Lamp Exam       Right Left   Lids/Lashes Dermatochalasis - upper lid Dermatochalasis - upper lid   Conjunctiva/Sclera White and quiet White and quiet   Cornea mild arcus, trace PEE, trace tear film debris trace Punctate epithelial erosions, mild arcus, well healed cataract wound   Anterior Chamber deep and clear deep and clear   Iris Round and dilated, No NVI Round and dilated, No NVI   Lens 2+ Nuclear sclerosis, 2+ Cortical cataract PC IOL in good position   Anterior Vitreous Vitreous syneresis post vitrectomy, clear         Fundus Exam       Right Left   Disc Pink and Sharp trace Pallor, Sharp rim   C/D Ratio 0.3 0.3   Macula Flat, good foveal reflex, mild RPE mottling, mild drusen, mild ERM, No heme or edema Flat, attached, blunted foveal reflex, rare, fine drusen, RPE mottling, trace ERM   Vessels mild attenuation, mild tortuosity attenuated, mild tortuosity   Periphery Attached, No RT/RD, No heme Attached over buckle, good buckle height, good laser over buckle  and around tears, PRE-OP: Bullous ST detachment from 1100-0400 with small holes at 1200           IMAGING AND PROCEDURES  Imaging and Procedures for 07/14/2022  OCT, Retina - OU - Both Eyes       Right Eye Quality was good. Central Foveal Thickness: 251. Progression has been  stable. Findings include normal foveal contour, no IRF, no SRF, vitreomacular adhesion .   Left Eye Quality was good. Central Foveal Thickness: 256. Progression has improved. Findings include normal foveal contour, no IRF, no SRF, outer retinal atrophy (Retina re-attached; decrease in ellipsoid signal centrally, focal disruption of ellipsoid subfoveally, Scattered focal pockets of shallow SRF superior midzone seen best on widefield -- improving).   Notes *Images captured and stored on drive  Diagnosis / Impression:  OD: NFP, no IRF/SRF OS: Retina re-attached; decrease in ellipsoid signal centrally, focal disruption of ellipsoid subfoveally, Scattered focal pockets of shallow SRF superior midzone seen best on widefield -- improving  Clinical management:  See below  Abbreviations: NFP - Normal foveal profile. CME - cystoid macular edema. PED - pigment epithelial detachment. IRF - intraretinal fluid. SRF - subretinal fluid. EZ - ellipsoid zone. ERM - epiretinal membrane. ORA - outer retinal atrophy. ORT - outer retinal tubulation. SRHM - subretinal hyper-reflective material. IRHM - intraretinal hyper-reflective material             ASSESSMENT/PLAN:    ICD-10-CM   1. Left retinal detachment  H33.22 OCT, Retina - OU - Both Eyes    2. Diabetes mellitus type 2 without retinopathy ([place])  E11.9     3. Combined forms of age-related cataract of right eye  H25.811     4. Pseudophakia  Z96.1      Rhegmatogenous retinal detachment, OS - bullous ST mac off detachment, onset of foveal involvement about 1-2 weeks prior to presentation by pt history - detached from 11 to 4 oclock, fovea off, microtears ~130  oclock within areas of very thin retina - s/p SBP + PPV/PFO/EL/FAX/14% C3F8 OS, 05.18.2023             - doing well             - retina attached and in good position -- good buckle height and laser around breaks  - fibrin rxn stably resolved; no PS             - IOP okay at 17  - f/u 6 months, DFE, OCT  2. Diabetes mellitus, type 2 without retinopathy - The incidence, risk factors for progression, natural history and treatment options for diabetic retinopathy  were discussed with patient.   - The need for close monitoring of blood glucose, blood pressure, and serum lipids, avoiding cigarette or any type of tobacco, and the need for long term follow up was also discussed with patient.  - f/u in 1 year, sooner prn  3. Mixed Cataract OD - The symptoms of cataract, surgical options, and treatments and risks were discussed with patient. - discussed diagnosis and progression - under the expert management of Dr. Ellie Lunch -- scheduled to f/u in 1 year  4. Pseudophakia OS  - s/p CE/IOL OS (Dr. Ellie Lunch, 09.28.23)  - IOL in good position, doing well  - monitor   Ophthalmic Meds Ordered this visit:  No orders of the defined types were placed in this encounter.    Return in about 6 months (around 01/12/2023) for f/u RD OS, DFE, OCT.  There are no Patient Instructions on file for this visit.  Explained the diagnoses, plan, and follow up with the patient and they expressed understanding.  Patient expressed understanding of the importance of proper follow up care.   This document serves as a record of services personally performed by Gardiner Sleeper, MD, PhD. It was created on their behalf by Altha Harm  Shamlian, [place] an ophthalmic technician. The creation of this record is the provider's dictation and/or activities during the visit.    Electronically signed by:  Renaldo Reel, COT  11.07.23  8:34 AM  This document serves as a record of services personally performed by Gardiner Sleeper, MD, PhD.  It was created on their behalf by San Jetty. Owens Shark, OA an ophthalmic technician. The creation of this record is the provider's dictation and/or activities during the visit.    Electronically signed by: San Jetty. Owens Shark, [place] 11.21.2023 8:34 AM  Gardiner Sleeper, M.D., Ph.D. Diseases & Surgery of the Retina and Vitreous [facility]  I have reviewed the above documentation for accuracy and completeness, and I agree with the above. Gardiner Sleeper, M.D., Ph.D. 07/14/22 8:34 AM  Abbreviations: M myopia (nearsighted); A astigmatism; H hyperopia (farsighted); P presbyopia; Mrx spectacle prescription;  CTL contact lenses; OD right eye; OS left eye; OU both eyes  XT exotropia; ET esotropia; PEK punctate epithelial keratitis; PEE punctate epithelial erosions; DES dry eye syndrome; MGD meibomian gland dysfunction; ATs artificial tears; PFAT's preservative free artificial tears; [place] nuclear sclerotic cataract; PSC posterior subcapsular cataract; ERM epi-retinal membrane; PVD posterior vitreous detachment; RD retinal detachment; DM diabetes mellitus; DR diabetic retinopathy; NPDR non-proliferative diabetic retinopathy; PDR proliferative diabetic retinopathy; CSME clinically significant macular edema; DME diabetic macular edema; dbh dot blot hemorrhages; CWS cotton wool spot; POAG primary open angle glaucoma; C/D cup-to-disc ratio; HVF humphrey visual field; GVF goldmann visual field; OCT optical coherence tomography; IOP intraocular pressure; BRVO Branch retinal vein occlusion; CRVO central retinal vein occlusion; CRAO central retinal artery occlusion; BRAO branch retinal artery occlusion; RT retinal tear; SB scleral buckle; PPV pars plana vitrectomy; VH Vitreous hemorrhage; PRP panretinal laser photocoagulation; IVK intravitreal kenalog; VMT vitreomacular traction; MH Macular hole;  NVD neovascularization of the disc; NVE neovascularization elsewhere; AREDS age related eye disease study; ARMD age  related macular degeneration; POAG primary open angle glaucoma; EBMD epithelial/anterior basement membrane dystrophy; ACIOL anterior chamber intraocular lens; IOL intraocular lens; PCIOL posterior chamber intraocular lens; Phaco/IOL phacoemulsification with intraocular lens placement; [place] photorefractive keratectomy; LASIK laser assisted in situ keratomileusis; HTN hypertension; DM diabetes mellitus; COPD chronic obstructive pulmonary disease

## 2022-07-14 ENCOUNTER — Encounter (INDEPENDENT_AMBULATORY_CARE_PROVIDER_SITE_OTHER): Payer: Self-pay | Admitting: Ophthalmology

## 2022-07-14 ENCOUNTER — Ambulatory Visit (INDEPENDENT_AMBULATORY_CARE_PROVIDER_SITE_OTHER): Payer: Medicare Other | Admitting: Ophthalmology

## 2022-07-14 DIAGNOSIS — Z961 Presence of intraocular lens: Secondary | ICD-10-CM

## 2022-07-14 DIAGNOSIS — H3322 Serous retinal detachment, left eye: Secondary | ICD-10-CM

## 2022-07-14 DIAGNOSIS — E119 Type 2 diabetes mellitus without complications: Secondary | ICD-10-CM

## 2022-07-14 DIAGNOSIS — H25811 Combined forms of age-related cataract, right eye: Secondary | ICD-10-CM | POA: Diagnosis not present

## 2022-07-14 DIAGNOSIS — H25813 Combined forms of age-related cataract, bilateral: Secondary | ICD-10-CM

## 2023-01-11 DIAGNOSIS — L821 Other seborrheic keratosis: Secondary | ICD-10-CM | POA: Diagnosis not present

## 2023-01-11 DIAGNOSIS — K529 Noninfective gastroenteritis and colitis, unspecified: Secondary | ICD-10-CM | POA: Diagnosis not present

## 2023-01-11 DIAGNOSIS — R5383 Other fatigue: Secondary | ICD-10-CM | POA: Diagnosis not present

## 2023-01-11 DIAGNOSIS — R030 Elevated blood-pressure reading, without diagnosis of hypertension: Secondary | ICD-10-CM | POA: Diagnosis not present

## 2023-01-12 ENCOUNTER — Encounter (INDEPENDENT_AMBULATORY_CARE_PROVIDER_SITE_OTHER): Payer: Medicare Other | Admitting: Ophthalmology

## 2023-01-12 DIAGNOSIS — R197 Diarrhea, unspecified: Secondary | ICD-10-CM | POA: Diagnosis not present

## 2023-01-12 DIAGNOSIS — H25811 Combined forms of age-related cataract, right eye: Secondary | ICD-10-CM

## 2023-01-12 DIAGNOSIS — E119 Type 2 diabetes mellitus without complications: Secondary | ICD-10-CM

## 2023-01-12 DIAGNOSIS — H25813 Combined forms of age-related cataract, bilateral: Secondary | ICD-10-CM

## 2023-01-12 DIAGNOSIS — H3322 Serous retinal detachment, left eye: Secondary | ICD-10-CM

## 2023-01-12 DIAGNOSIS — Z961 Presence of intraocular lens: Secondary | ICD-10-CM

## 2023-01-12 DIAGNOSIS — Z7984 Long term (current) use of oral hypoglycemic drugs: Secondary | ICD-10-CM

## 2023-01-14 DIAGNOSIS — K529 Noninfective gastroenteritis and colitis, unspecified: Secondary | ICD-10-CM | POA: Diagnosis not present

## 2023-01-26 DIAGNOSIS — R5383 Other fatigue: Secondary | ICD-10-CM | POA: Diagnosis not present

## 2023-02-02 DIAGNOSIS — R030 Elevated blood-pressure reading, without diagnosis of hypertension: Secondary | ICD-10-CM | POA: Diagnosis not present

## 2023-02-02 DIAGNOSIS — K529 Noninfective gastroenteritis and colitis, unspecified: Secondary | ICD-10-CM | POA: Diagnosis not present

## 2023-02-02 DIAGNOSIS — R5383 Other fatigue: Secondary | ICD-10-CM | POA: Diagnosis not present

## 2023-02-02 DIAGNOSIS — L821 Other seborrheic keratosis: Secondary | ICD-10-CM | POA: Diagnosis not present

## 2023-02-02 NOTE — Progress Notes (Signed)
[facility] - Clinic Note  02/10/2023     CHIEF COMPLAINT Patient presents for Retina Follow Up   HISTORY OF PRESENT ILLNESS: Melody Freeman is a 72 y.o. female who presents to the clinic today for:   HPI     Retina Follow Up   Patient presents with  Retinal Break/Detachment.  In left eye.  Severity is moderate.  Duration of 6 months.  Since onset it is stable.  I, the attending physician,  performed the HPI with the patient and updated documentation appropriately.        Comments   Patient states that the vision in the left eye has a blur to it. She is not using eye drops at this time. She does not check her blood sugar.       Last edited by [name], MD on 02/10/2023  9:00 PM.    Pt states left eye is still blurry, she noticed a new floater in her right eye about 4 weeks ago   Referring physician: Donetta Potts, MD [address] [place],  [place] [address]  HISTORICAL INFORMATION:   Selected notes from the MEDICAL RECORD NUMBER Referred by Dr. Conley Rolls for concern of RD OS LEE:  Ocular Hx- PMH-    CURRENT MEDICATIONS: Current Outpatient Medications (Ophthalmic Drugs)  Medication Sig   prednisoLONE acetate (PRED FORTE) 1 % ophthalmic suspension Place 1 drop into the left eye 3 (three) times daily.   No current facility-administered medications for this visit. (Ophthalmic Drugs)   Current Outpatient Medications (Other)  Medication Sig   acetaminophen (TYLENOL) 500 MG tablet Take 1,000 mg by mouth every 6 (six) hours as needed.   ibuprofen (ADVIL) 200 MG tablet Take 600 mg by mouth every 6 (six) hours as needed for headache or moderate pain.   metFORMIN (GLUCOPHAGE-XR) 500 MG 24 hr tablet Take 1,000 mg by mouth 2 (two) times daily.   omeprazole (PRILOSEC) 20 MG capsule Take 20 mg by mouth daily as needed (acid reflux).   rosuvastatin (CRESTOR) 10 MG tablet Take 10 mg by mouth daily.   sertraline (ZOLOFT) 50 MG tablet Take 50 mg by mouth daily.    No current facility-administered medications for this visit. (Other)   REVIEW OF SYSTEMS: ROS   Positive for: Gastrointestinal, Endocrine, Eyes, Psychiatric Negative for: Constitutional, Neurological, Skin, Genitourinary, Musculoskeletal, HENT, Cardiovascular, Respiratory, Allergic/Imm, Heme/Lymph Last edited by Charlette Caffey, COT on 02/10/2023  8:41 AM.       ALLERGIES Allergies  Allergen Reactions   Codeine Nausea And Vomiting   PAST MEDICAL HISTORY Past Medical History:  Diagnosis Date   Arthritis    Depression    Diabetes mellitus without complication (HCC)    GERD (gastroesophageal reflux disease)    PONV (postoperative nausea and vomiting)    Past Surgical History:  Procedure Laterality Date   ABDOMINAL HYSTERECTOMY     AIR/FLUID EXCHANGE Left 01/08/2022   Procedure: AIR/FLUID EXCHANGE;  Surgeon: [name], MD;  Location: [facility] OR;  Service: Ophthalmology;  Laterality: Left;   CHOLECYSTECTOMY     GAS INSERTION Left 01/08/2022   Procedure: INSERTION OF GAS C3F8;  Surgeon: [name], MD;  Location: [facility] OR;  Service: Ophthalmology;  Laterality: Left;   PERFLUORONE INJECTION Left 01/08/2022   Procedure: PERFLUORON INJECTION;  Surgeon: [name], MD;  Location: [facility] OR;  Service: Ophthalmology;  Laterality: Left;   PHOTOCOAGULATION WITH LASER Left 01/08/2022   Procedure: PHOTOCOAGULATION WITH LASER;  Surgeon: [name],  MD;  Location: [facility] OR;  Service: Ophthalmology;  Laterality: Left;   VITRECTOMY 25 GAUGE WITH SCLERAL BUCKLE Left 01/08/2022   Procedure: TWENTY-FIVE GAUGE PARS PLANA VITRECTOMY WITH SCLERAL BUCKLE;  Surgeon: [name], MD;  Location: [facility] OR;  Service: Ophthalmology;  Laterality: Left;   FAMILY HISTORY History reviewed. No pertinent family history.  SOCIAL HISTORY Social History   Tobacco Use   Smoking status: Never   Smokeless tobacco: Never  Vaping Use   Vaping Use: Never used  Substance Use Topics   Alcohol use: Never   Drug  use: Never       OPHTHALMIC EXAM:  Base Eye Exam     Visual Acuity (Snellen - Linear)       Right Left   Dist cc 20/25 20/40   Dist ph cc NI NI    Correction: Glasses         Tonometry (Tonopen, 8:45 AM)       Right Left   Pressure 12 15         Pupils       Dark Light Shape React APD   Right 4 3 Round Slow None   Left 6 6 Round NR None         Visual Fields       Left Right    Full Full         Extraocular Movement       Right Left    Full, Ortho Full, Ortho         Neuro/Psych     Oriented x3: Yes   Mood/Affect: Normal         Dilation     Both eyes: 1.0% Mydriacyl, 2.5% Phenylephrine @ 8:42 AM           Slit Lamp and Fundus Exam     Slit Lamp Exam       Right Left   Lids/Lashes Dermatochalasis - upper lid Dermatochalasis - upper lid   Conjunctiva/Sclera White and quiet White and quiet   Cornea mild arcus, trace PEE, trace tear film debris trace Punctate epithelial erosions, mild arcus, well healed cataract wound, trace tear film debris   Anterior Chamber deep and clear deep and clear   Iris Round and dilated, No NVI Round and dilated, No NVI   Lens 2+ Nuclear sclerosis, 2-3+ Cortical cataract PC IOL in good position, 1-2+ Posterior capsular opacification   Anterior Vitreous Vitreous syneresis, Posterior vitreous detachment post vitrectomy, clear         Fundus Exam       Right Left   Disc Pink and Sharp trace Pallor, Sharp rim   C/D Ratio 0.3 0.3   Macula Flat, good foveal reflex, mild RPE mottling, fine drusen, mild ERM, No heme or edema Flat, attached, blunted foveal reflex, rare, fine drusen, RPE mottling, trace ERM   Vessels attenuated, mild tortuosity attenuated, mild tortuosity   Periphery Attached, No RT/RD, No heme Attached over buckle, good buckle height, good laser over buckle and around tears, PRE-OP: Bullous ST detachment from 1100-0400 with small holes at 1200           Refraction     Wearing Rx        Sphere Cylinder Axis Add   Right +1.25 +1.00 015 +2.25   Left +1.50 Sphere  +2.25           IMAGING AND PROCEDURES  Imaging and Procedures for 02/10/2023  OCT, Retina - OU - Both Eyes  Right Eye Quality was good. Central Foveal Thickness: 257. Progression has been stable. Findings include normal foveal contour, no IRF, no SRF (Interval release of partial PVD).   Left Eye Quality was good. Central Foveal Thickness: 256. Progression has improved. Findings include normal foveal contour, no IRF, no SRF, outer retinal atrophy (Retina re-attached; decrease in ellipsoid signal centrally -- improved, Scattered focal pockets of shallow SRF superior midzone seen best on widefield -- improving, trace ERM).   Notes *Images captured and stored on drive  Diagnosis / Impression:  OD: NFP, no IRF/SRF, interval release of partial PVD OS: Retina re-attached; improving ellipsoid signal centrally; Scattered focal pockets of shallow SRF superior midzone seen best on widefield -- improving, trace ERM  Clinical management:  See below  Abbreviations: NFP - Normal foveal profile. CME - cystoid macular edema. PED - pigment epithelial detachment. IRF - intraretinal fluid. SRF - subretinal fluid. EZ - ellipsoid zone. ERM - epiretinal membrane. ORA - outer retinal atrophy. ORT - outer retinal tubulation. SRHM - subretinal hyper-reflective material. IRHM - intraretinal hyper-reflective material            ASSESSMENT/PLAN:    ICD-10-CM   1. Left retinal detachment  H33.22 OCT, Retina - OU - Both Eyes    2. Diabetes mellitus type 2 without retinopathy (HCC)  E11.9     3. Diabetes mellitus treated with insulin and oral medication (HCC)  E11.9    Z79.4    Z79.84     4. Combined forms of age-related cataract of right eye  H25.811     5. Pseudophakia  Z96.1     6. PCO (posterior capsular opacification), left  H26.492      Rhegmatogenous retinal detachment, OS - bullous ST mac off  detachment, onset of foveal involvement about 1-2 weeks prior to presentation by pt history - detached from 11 to 4 oclock, fovea off, microtears ~130 oclock within areas of very thin retina - s/p SBP + PPV/PFO/EL/FAX/14% C3F8 OS, 05.18.2023             - doing well             - retina attached and in good position -- good buckle height and laser around breaks  - fibrin rxn stably resolved; no PS             - IOP okay at 17  - f/u 6 months, DFE, OCT  2,3. Diabetes mellitus, type 2 without retinopathy - The incidence, risk factors for progression, natural history and treatment options for diabetic retinopathy  were discussed with patient.   - The need for close monitoring of blood glucose, blood pressure, and serum lipids, avoiding cigarette or any type of tobacco, and the need for long term follow up was also discussed with patient.  - f/u in 1 year, sooner prn  4. Mixed Cataract OD - The symptoms of cataract, surgical options, and treatments and risks were discussed with patient. - discussed diagnosis and progression - under the expert management of Dr. Charlotte Sanes -- scheduled to f/u in 1 year  5. Pseudophakia OS  - s/p CE/IOL OS (Dr. Charlotte Sanes, 09.28.23)  - IOL in good position, doing well  - monitor  6. PCO OS  - 1-2+ PCO  - will refer back to Dr. Charlotte Sanes for YAG eval  Ophthalmic Meds Ordered this visit:  No orders of the defined types were placed in this encounter.    Return in about 1 year (around 02/10/2024) for f/u RD  OS, DFE, OCT.  There are no Patient Instructions on file for this visit.  Explained the diagnoses, plan, and follow up with the patient and they expressed understanding.  Patient expressed understanding of the importance of proper follow up care.   This document serves as a record of services personally performed by Karie Chimera, MD, PhD. It was created on their behalf by De Blanch, an ophthalmic technician. The creation of this record is the provider's  dictation and/or activities during the visit.    Electronically signed by: De Blanch, OA, 02/10/23  9:04 PM  This document serves as a record of services personally performed by Karie Chimera, MD, PhD. It was created on their behalf by Glee Arvin. Manson Passey, OA an ophthalmic technician. The creation of this record is the provider's dictation and/or activities during the visit.    Electronically signed by: Glee Arvin. Kristopher Oppenheim 06.19.2024 9:04 PM  Karie Chimera, M.D., Ph.D. Diseases & Surgery of the Retina and Vitreous [facility]  I have reviewed the above documentation for accuracy and completeness, and I agree with the above. Karie Chimera, M.D., Ph.D. 02/10/23 9:04 PM  Abbreviations: M myopia (nearsighted); A astigmatism; H hyperopia (farsighted); P presbyopia; Mrx spectacle prescription;  CTL contact lenses; OD right eye; OS left eye; OU both eyes  XT exotropia; ET esotropia; PEK punctate epithelial keratitis; PEE punctate epithelial erosions; DES dry eye syndrome; MGD meibomian gland dysfunction; ATs artificial tears; PFAT's preservative free artificial tears; NSC nuclear sclerotic cataract; PSC posterior subcapsular cataract; ERM epi-retinal membrane; PVD posterior vitreous detachment; RD retinal detachment; DM diabetes mellitus; DR diabetic retinopathy; NPDR non-proliferative diabetic retinopathy; PDR proliferative diabetic retinopathy; CSME clinically significant macular edema; DME diabetic macular edema; dbh dot blot hemorrhages; CWS cotton wool spot; POAG primary open angle glaucoma; C/D cup-to-disc ratio; HVF humphrey visual field; GVF goldmann visual field; OCT optical coherence tomography; IOP intraocular pressure; BRVO Branch retinal vein occlusion; CRVO central retinal vein occlusion; CRAO central retinal artery occlusion; BRAO branch retinal artery occlusion; RT retinal tear; SB scleral buckle; PPV pars plana vitrectomy; VH Vitreous hemorrhage; PRP  panretinal laser photocoagulation; IVK intravitreal kenalog; VMT vitreomacular traction; MH Macular hole;  NVD neovascularization of the disc; NVE neovascularization elsewhere; AREDS age related eye disease study; ARMD age related macular degeneration; POAG primary open angle glaucoma; EBMD epithelial/anterior basement membrane dystrophy; ACIOL anterior chamber intraocular lens; IOL intraocular lens; PCIOL posterior chamber intraocular lens; Phaco/IOL phacoemulsification with intraocular lens placement; PRK photorefractive keratectomy; LASIK laser assisted in situ keratomileusis; HTN hypertension; DM diabetes mellitus; COPD chronic obstructive pulmonary disease

## 2023-02-10 ENCOUNTER — Ambulatory Visit (INDEPENDENT_AMBULATORY_CARE_PROVIDER_SITE_OTHER): Payer: Medicare Other | Admitting: Ophthalmology

## 2023-02-10 ENCOUNTER — Encounter (INDEPENDENT_AMBULATORY_CARE_PROVIDER_SITE_OTHER): Payer: Self-pay | Admitting: Ophthalmology

## 2023-02-10 DIAGNOSIS — Z794 Long term (current) use of insulin: Secondary | ICD-10-CM | POA: Diagnosis not present

## 2023-02-10 DIAGNOSIS — Z7984 Long term (current) use of oral hypoglycemic drugs: Secondary | ICD-10-CM

## 2023-02-10 DIAGNOSIS — Z961 Presence of intraocular lens: Secondary | ICD-10-CM

## 2023-02-10 DIAGNOSIS — H25811 Combined forms of age-related cataract, right eye: Secondary | ICD-10-CM

## 2023-02-10 DIAGNOSIS — H3322 Serous retinal detachment, left eye: Secondary | ICD-10-CM | POA: Diagnosis not present

## 2023-02-10 DIAGNOSIS — H26492 Other secondary cataract, left eye: Secondary | ICD-10-CM

## 2023-02-10 DIAGNOSIS — E119 Type 2 diabetes mellitus without complications: Secondary | ICD-10-CM

## 2023-02-18 DIAGNOSIS — H26492 Other secondary cataract, left eye: Secondary | ICD-10-CM | POA: Diagnosis not present

## 2024-01-31 NOTE — Progress Notes (Shared)
 [facility] - Clinic Note  02/09/2024     CHIEF COMPLAINT Patient presents for Retina Follow Up   HISTORY OF PRESENT ILLNESS: Melody Freeman is a 73 y.o. female who presents to the clinic today for:   HPI     Retina Follow Up           Diagnosis: Retinal Break/Detachment   Laterality: left eye   Onset: 1 year ago   Duration: 1 year   Course: stable         Comments   1 year retina follow up RD OS pt is reporting no vision changes noticed she has some floaters denies any flashes pt want to be referred for cat sx      Last edited by Alise Appl, COT on 02/09/2024  8:21 AM.    Pt states her vision is okay, she wants to get the right eye cataract off, she's had Yag OS with Dr. Juanito Norma   Referring physician: Lauran Pollard, MD [address] [place],  [place] [address]  HISTORICAL INFORMATION:   Selected notes from the MEDICAL RECORD NUMBER Referred by Dr. Buck Carbon for concern of RD OS LEE:  Ocular Hx- PMH-    CURRENT MEDICATIONS: Current Outpatient Medications (Ophthalmic Drugs)  Medication Sig   prednisoLONE  acetate (PRED FORTE ) 1 % ophthalmic suspension Place 1 drop into the left eye 3 (three) times daily.   No current facility-administered medications for this visit. (Ophthalmic Drugs)   Current Outpatient Medications (Other)  Medication Sig   acetaminophen  (TYLENOL ) 500 MG tablet Take 1,000 mg by mouth every 6 (six) hours as needed.   ibuprofen (ADVIL) 200 MG tablet Take 600 mg by mouth every 6 (six) hours as needed for headache or moderate pain.   metFORMIN (GLUCOPHAGE-XR) 500 MG 24 hr tablet Take 1,000 mg by mouth 2 (two) times daily.   omeprazole (PRILOSEC) 20 MG capsule Take 20 mg by mouth daily as needed (acid reflux).   rosuvastatin (CRESTOR) 10 MG tablet Take 10 mg by mouth daily.   sertraline (ZOLOFT) 50 MG tablet Take 50 mg by mouth daily.   No current facility-administered medications for this visit. (Other)   REVIEW OF  SYSTEMS: ROS   Positive for: Gastrointestinal, Endocrine, Eyes, Psychiatric Negative for: Constitutional, Neurological, Skin, Genitourinary, Musculoskeletal, HENT, Cardiovascular, Respiratory, Allergic/Imm, Heme/Lymph Last edited by Alise Appl, COT on 02/09/2024  8:21 AM.        ALLERGIES Allergies  Allergen Reactions   Codeine Nausea And Vomiting   PAST MEDICAL HISTORY Past Medical History:  Diagnosis Date   Arthritis    Depression    Diabetes mellitus without complication (HCC)    GERD (gastroesophageal reflux disease)    PONV (postoperative nausea and vomiting)    Past Surgical History:  Procedure Laterality Date   ABDOMINAL HYSTERECTOMY     AIR/FLUID EXCHANGE Left 01/08/2022   Procedure: AIR/FLUID EXCHANGE;  Surgeon: Ronelle Coffee, MD;  Location: [facility] OR;  Service: Ophthalmology;  Laterality: Left;   CHOLECYSTECTOMY     GAS INSERTION Left 01/08/2022   Procedure: INSERTION OF GAS C3F8;  Surgeon: Ronelle Coffee, MD;  Location: [facility] OR;  Service: Ophthalmology;  Laterality: Left;   PERFLUORONE INJECTION Left 01/08/2022   Procedure: PERFLUORON INJECTION;  Surgeon: Ronelle Coffee, MD;  Location: [facility] OR;  Service: Ophthalmology;  Laterality: Left;   PHOTOCOAGULATION WITH LASER Left 01/08/2022   Procedure: PHOTOCOAGULATION WITH LASER;  Surgeon: Ronelle Coffee, MD;  Location: [facility] OR;  Service:  Ophthalmology;  Laterality: Left;   VITRECTOMY 25 GAUGE WITH SCLERAL BUCKLE Left 01/08/2022   Procedure: TWENTY-FIVE GAUGE PARS PLANA VITRECTOMY WITH SCLERAL BUCKLE;  Surgeon: Ronelle Coffee, MD;  Location: [facility] OR;  Service: Ophthalmology;  Laterality: Left;   FAMILY HISTORY History reviewed. No pertinent family history.  SOCIAL HISTORY Social History   Tobacco Use   Smoking status: Never   Smokeless tobacco: Never  Vaping Use   Vaping status: Never Used  Substance Use Topics   Alcohol use: Never   Drug use: Never       OPHTHALMIC EXAM:  Base Eye Exam     Visual Acuity  (Snellen - Linear)       Right Left   Dist cc 20/30 20/40   Dist ph cc NI NI         Tonometry (Tonopen, 8:27 AM)       Right Left   Pressure 9 12         Pupils       Pupils Dark Light Shape React APD   Right PERRL 4 3 Round Brisk None   Left PERRL 4 4 Round NR          Visual Fields       Left Right     Full         Extraocular Movement       Right Left    Full, Ortho Full, Ortho         Neuro/Psych     Oriented x3: Yes   Mood/Affect: Normal         Dilation     Both eyes: 2.5% Phenylephrine  @ 8:27 AM           Slit Lamp and Fundus Exam     Slit Lamp Exam       Right Left   Lids/Lashes Dermatochalasis - upper lid Dermatochalasis - upper lid   Conjunctiva/Sclera White and quiet White and quiet   Cornea mild arcus, trace PEE, trace tear film debris trace Punctate epithelial erosions, mild arcus, well healed cataract wound, trace tear film debris   Anterior Chamber deep and clear deep and clear   Iris Round and dilated, No NVI Round and dilated, No NVI   Lens 2-3+ Nuclear sclerosis, 2-3+ Cortical cataract PC IOL in good position with open PC   Vitreous Vitreous syneresis, Posterior vitreous detachment post vitrectomy, clear         Fundus Exam       Right Left   Disc Pink and Sharp trace Pallor, Sharp rim   C/D Ratio 0.3 0.3   Macula Flat, good foveal reflex, mild RPE mottling, fine drusen, mild ERM, No heme or edema Flat, attached, blunted foveal reflex, rare, fine drusen, RPE mottling, trace ERM   Vessels attenuated, mild tortuosity attenuated, mild tortuosity   Periphery Attached, No RT/RD, No heme Attached over buckle, good buckle height, good laser over buckle and around tears, PRE-OP: Bullous ST detachment from 1100-0400 with small holes at 1200           Refraction     Wearing Rx       Sphere Cylinder Axis Add   Right +1.25 +1.00 015 +2.25   Left +1.50 Sphere  +2.25           IMAGING AND PROCEDURES  Imaging  and Procedures for 02/09/2024  OCT, Retina - OU - Both Eyes       Right Eye Quality was good. Central Foveal  Thickness: 250. Progression has been stable. Findings include normal foveal contour, no IRF, no SRF.   Left Eye Quality was good. Central Foveal Thickness: 267. Progression has improved. Findings include normal foveal contour, no IRF, no SRF, outer retinal atrophy (Retina re-attached; interval improvement in ellipsoid signal centrally, trace ERM, patchy ORA peripherally).   Notes *Images captured and stored on drive  Diagnosis / Impression:  OD: NFP, no IRF/SRF OS: Retina re-attached; interval improvement in ellipsoid signal centrally, trace ERM, patchy ORA peripherally  Clinical management:  See below  Abbreviations: NFP - Normal foveal profile. CME - cystoid macular edema. PED - pigment epithelial detachment. IRF - intraretinal fluid. SRF - subretinal fluid. EZ - ellipsoid zone. ERM - epiretinal membrane. ORA - outer retinal atrophy. ORT - outer retinal tubulation. SRHM - subretinal hyper-reflective material. IRHM - intraretinal hyper-reflective material             ASSESSMENT/PLAN:    ICD-10-CM   1. Left retinal detachment  H33.22 OCT, Retina - OU - Both Eyes    2. Diabetes mellitus type 2 without retinopathy (HCC)  E11.9     3. Diabetes mellitus treated with insulin  and oral medication (HCC)  E11.9    Z79.4    Z79.84     4. Combined forms of age-related cataract of right eye  H25.811     5. Pseudophakia  Z96.1       Rhegmatogenous retinal detachment, OS - bullous ST mac off detachment, onset of foveal involvement about 1-2 weeks prior to presentation by pt history - detached from 11 to 4 oclock, fovea off, microtears ~130 oclock within areas of very thin retina - s/p SBP + PPV/PFO/EL/FAX/14% C3F8 OS, 05.18.2023             - doing well             - retina attached and in good position -- good buckle height and laser around breaks  - fibrin rxn stably  resolved; no PS             - IOP okay at 12  - f/u in 1-2 year, DFE, OCT  2,3. Diabetes mellitus, type 2 without retinopathy - The incidence, risk factors for progression, natural history and treatment options for diabetic retinopathy  were discussed with patient.   - The need for close monitoring of blood glucose, blood pressure, and serum lipids, avoiding cigarette or any type of tobacco, and the need for long term follow up was also discussed with patient.  - montior  4. Mixed Cataract OD - The symptoms of cataract, surgical options, and treatments and risks were discussed with patient. - discussed diagnosis and progression - will refer back to Dr. McCuen  5. Pseudophakia OS  - s/p CE/IOL OS (Dr. Juanito Norma, 09.28.23)  - IOL in good position, doing well  - monitor   Ophthalmic Meds Ordered this visit:  No orders of the defined types were placed in this encounter.    Return for f/u 1-2 years, RD OS, DFE, OCT.  There are no Patient Instructions on file for this visit.  Explained the diagnoses, plan, and follow up with the patient and they expressed understanding.  Patient expressed understanding of the importance of proper follow up care.   This document serves as a record of services personally performed by Jeanice Millard, MD, PhD. It was created on their behalf by Angelia Kelp, an ophthalmic technician. The creation of this record is the provider's dictation and/or activities  during the visit.    Electronically signed by: Angelia Kelp, OA, 02/09/24  9:22 AM  This document serves as a record of services personally performed by Jeanice Millard, MD, PhD. It was created on their behalf by Morley Arabia. Bevin Bucks, OA an ophthalmic technician. The creation of this record is the provider's dictation and/or activities during the visit.    Electronically signed by: Morley Arabia. Bevin Bucks, OA 02/09/24 9:22 AM   Jeanice Millard, M.D., Ph.D. Diseases & Surgery of the Retina and Vitreous [facility]   Abbreviations: M myopia (nearsighted); A astigmatism; H hyperopia (farsighted); P presbyopia; Mrx spectacle prescription;  CTL contact lenses; OD right eye; OS left eye; OU both eyes  XT exotropia; ET esotropia; PEK punctate epithelial keratitis; PEE punctate epithelial erosions; DES dry eye syndrome; MGD meibomian gland dysfunction; ATs artificial tears; PFAT's preservative free artificial tears; NSC nuclear sclerotic cataract; PSC posterior subcapsular cataract; ERM epi-retinal membrane; PVD posterior vitreous detachment; RD retinal detachment; DM diabetes mellitus; DR diabetic retinopathy; NPDR non-proliferative diabetic retinopathy; PDR proliferative diabetic retinopathy; CSME clinically significant macular edema; DME diabetic macular edema; dbh dot blot hemorrhages; CWS cotton wool spot; POAG primary open angle glaucoma; C/D cup-to-disc ratio; HVF humphrey visual field; GVF goldmann visual field; OCT optical coherence tomography; IOP intraocular pressure; BRVO Branch retinal vein occlusion; CRVO central retinal vein occlusion; CRAO central retinal artery occlusion; BRAO branch retinal artery occlusion; RT retinal tear; SB scleral buckle; PPV pars plana vitrectomy; VH Vitreous hemorrhage; PRP panretinal laser photocoagulation; IVK intravitreal kenalog ; VMT vitreomacular traction; MH Macular hole;  NVD neovascularization of the disc; NVE neovascularization elsewhere; AREDS age related eye disease study; ARMD age related macular degeneration; POAG primary open angle glaucoma; EBMD epithelial/anterior basement membrane dystrophy; ACIOL anterior chamber intraocular lens; IOL intraocular lens; PCIOL posterior chamber intraocular lens; Phaco/IOL phacoemulsification with intraocular lens placement; PRK photorefractive keratectomy; LASIK laser assisted in situ keratomileusis; HTN hypertension; DM diabetes mellitus; COPD chronic obstructive pulmonary disease

## 2024-02-09 ENCOUNTER — Ambulatory Visit (INDEPENDENT_AMBULATORY_CARE_PROVIDER_SITE_OTHER): Payer: Medicare Other | Admitting: Ophthalmology

## 2024-02-09 ENCOUNTER — Encounter (INDEPENDENT_AMBULATORY_CARE_PROVIDER_SITE_OTHER): Payer: Self-pay | Admitting: Ophthalmology

## 2024-02-09 DIAGNOSIS — H25811 Combined forms of age-related cataract, right eye: Secondary | ICD-10-CM

## 2024-02-09 DIAGNOSIS — Z7984 Long term (current) use of oral hypoglycemic drugs: Secondary | ICD-10-CM | POA: Diagnosis not present

## 2024-02-09 DIAGNOSIS — E119 Type 2 diabetes mellitus without complications: Secondary | ICD-10-CM | POA: Diagnosis not present

## 2024-02-09 DIAGNOSIS — Z961 Presence of intraocular lens: Secondary | ICD-10-CM

## 2024-02-09 DIAGNOSIS — H26492 Other secondary cataract, left eye: Secondary | ICD-10-CM

## 2024-02-09 DIAGNOSIS — H3322 Serous retinal detachment, left eye: Secondary | ICD-10-CM | POA: Diagnosis not present

## 2024-02-09 DIAGNOSIS — Z794 Long term (current) use of insulin: Secondary | ICD-10-CM | POA: Diagnosis not present

## 2024-02-13 ENCOUNTER — Encounter (INDEPENDENT_AMBULATORY_CARE_PROVIDER_SITE_OTHER): Payer: Self-pay | Admitting: Ophthalmology

## 2024-03-02 DIAGNOSIS — E119 Type 2 diabetes mellitus without complications: Secondary | ICD-10-CM | POA: Diagnosis not present

## 2024-03-02 DIAGNOSIS — H5201 Hypermetropia, right eye: Secondary | ICD-10-CM | POA: Diagnosis not present

## 2024-03-02 DIAGNOSIS — H2511 Age-related nuclear cataract, right eye: Secondary | ICD-10-CM | POA: Diagnosis not present

## 2024-03-09 DIAGNOSIS — L0390 Cellulitis, unspecified: Secondary | ICD-10-CM | POA: Diagnosis not present

## 2024-03-16 DIAGNOSIS — Z131 Encounter for screening for diabetes mellitus: Secondary | ICD-10-CM | POA: Diagnosis not present

## 2024-03-16 DIAGNOSIS — E875 Hyperkalemia: Secondary | ICD-10-CM | POA: Diagnosis not present

## 2024-03-16 DIAGNOSIS — E559 Vitamin D deficiency, unspecified: Secondary | ICD-10-CM | POA: Diagnosis not present

## 2024-03-16 DIAGNOSIS — I10 Essential (primary) hypertension: Secondary | ICD-10-CM | POA: Diagnosis not present

## 2024-03-16 DIAGNOSIS — R739 Hyperglycemia, unspecified: Secondary | ICD-10-CM | POA: Diagnosis not present

## 2024-03-16 DIAGNOSIS — E039 Hypothyroidism, unspecified: Secondary | ICD-10-CM | POA: Diagnosis not present

## 2024-03-16 DIAGNOSIS — E7849 Other hyperlipidemia: Secondary | ICD-10-CM | POA: Diagnosis not present

## 2024-03-29 DIAGNOSIS — E7800 Pure hypercholesterolemia, unspecified: Secondary | ICD-10-CM | POA: Diagnosis not present

## 2024-03-29 DIAGNOSIS — Z0001 Encounter for general adult medical examination with abnormal findings: Secondary | ICD-10-CM | POA: Diagnosis not present

## 2024-03-29 DIAGNOSIS — H8110 Benign paroxysmal vertigo, unspecified ear: Secondary | ICD-10-CM | POA: Diagnosis not present

## 2024-03-29 DIAGNOSIS — L821 Other seborrheic keratosis: Secondary | ICD-10-CM | POA: Diagnosis not present

## 2024-03-29 DIAGNOSIS — E559 Vitamin D deficiency, unspecified: Secondary | ICD-10-CM | POA: Diagnosis not present
